# Patient Record
Sex: Female | Born: 1950 | Race: White | Hispanic: Yes | Smoking: Never smoker
Health system: Southern US, Community
[De-identification: ages and names within clinical notes are randomized; demographics above are authoritative.]

## PROBLEM LIST (undated history)

## (undated) DIAGNOSIS — M1990 Unspecified osteoarthritis, unspecified site: Secondary | ICD-10-CM

## (undated) DIAGNOSIS — M545 Low back pain: Secondary | ICD-10-CM

## (undated) DIAGNOSIS — I209 Angina pectoris, unspecified: Secondary | ICD-10-CM

## (undated) DIAGNOSIS — E079 Disorder of thyroid, unspecified: Secondary | ICD-10-CM

## (undated) DIAGNOSIS — Z87898 Personal history of other specified conditions: Secondary | ICD-10-CM

## (undated) DIAGNOSIS — G8929 Other chronic pain: Secondary | ICD-10-CM

## (undated) DIAGNOSIS — E039 Hypothyroidism, unspecified: Secondary | ICD-10-CM

## (undated) DIAGNOSIS — F32A Depression, unspecified: Secondary | ICD-10-CM

## (undated) DIAGNOSIS — G4730 Sleep apnea, unspecified: Secondary | ICD-10-CM

## (undated) DIAGNOSIS — F329 Major depressive disorder, single episode, unspecified: Secondary | ICD-10-CM

## (undated) DIAGNOSIS — I4891 Unspecified atrial fibrillation: Secondary | ICD-10-CM

## (undated) DIAGNOSIS — K219 Gastro-esophageal reflux disease without esophagitis: Secondary | ICD-10-CM

## (undated) DIAGNOSIS — J189 Pneumonia, unspecified organism: Secondary | ICD-10-CM

## (undated) DIAGNOSIS — R0602 Shortness of breath: Secondary | ICD-10-CM

## (undated) DIAGNOSIS — I10 Essential (primary) hypertension: Secondary | ICD-10-CM

## (undated) DIAGNOSIS — T7840XA Allergy, unspecified, initial encounter: Secondary | ICD-10-CM

## (undated) DIAGNOSIS — M353 Polymyalgia rheumatica: Secondary | ICD-10-CM

## (undated) DIAGNOSIS — E785 Hyperlipidemia, unspecified: Secondary | ICD-10-CM

## (undated) HISTORY — DX: Gastro-esophageal reflux disease without esophagitis: K21.9

## (undated) HISTORY — DX: Polymyalgia rheumatica: M35.3

## (undated) HISTORY — DX: Allergy, unspecified, initial encounter: T78.40XA

## (undated) HISTORY — PX: ABDOMINAL HYSTERECTOMY: SHX81

## (undated) HISTORY — DX: Unspecified osteoarthritis, unspecified site: M19.90

## (undated) HISTORY — DX: Personal history of other specified conditions: Z87.898

## (undated) HISTORY — DX: Disorder of thyroid, unspecified: E07.9

## (undated) HISTORY — PX: CHOLECYSTECTOMY: SHX55

## (undated) HISTORY — PX: INCONTINENCE SURGERY: SHX676

## (undated) HISTORY — DX: Hypothyroidism, unspecified: E03.9

## (undated) HISTORY — DX: Hyperlipidemia, unspecified: E78.5

## (undated) HISTORY — PX: FRACTURE SURGERY: SHX138

---
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
## 1998-10-26 ENCOUNTER — Other Ambulatory Visit: Admission: RE | Admit: 1998-10-26 | Discharge: 1998-10-26 | Payer: Self-pay | Admitting: *Deleted

## 1998-12-07 ENCOUNTER — Encounter: Payer: Self-pay | Admitting: Urology

## 1998-12-07 ENCOUNTER — Encounter: Admission: RE | Admit: 1998-12-07 | Discharge: 1998-12-07 | Payer: Self-pay | Admitting: Urology

## 1999-06-06 ENCOUNTER — Encounter: Payer: Self-pay | Admitting: Podiatry

## 1999-06-06 ENCOUNTER — Ambulatory Visit (HOSPITAL_COMMUNITY): Admission: RE | Admit: 1999-06-06 | Discharge: 1999-06-06 | Payer: Self-pay | Admitting: Podiatry

## 2002-01-21 HISTORY — PX: ANKLE FRACTURE SURGERY: SHX122

## 2003-01-22 HISTORY — PX: TOTAL ABDOMINAL HYSTERECTOMY: SHX209

## 2003-03-19 ENCOUNTER — Emergency Department (HOSPITAL_COMMUNITY): Admission: EM | Admit: 2003-03-19 | Discharge: 2003-03-20 | Payer: Self-pay | Admitting: Emergency Medicine

## 2003-10-23 ENCOUNTER — Emergency Department (HOSPITAL_COMMUNITY): Admission: EM | Admit: 2003-10-23 | Discharge: 2003-10-24 | Payer: Self-pay | Admitting: Emergency Medicine

## 2004-03-19 ENCOUNTER — Emergency Department (HOSPITAL_COMMUNITY): Admission: EM | Admit: 2004-03-19 | Discharge: 2004-03-19 | Payer: Self-pay | Admitting: Emergency Medicine

## 2005-03-04 ENCOUNTER — Ambulatory Visit: Payer: Self-pay | Admitting: Internal Medicine

## 2005-03-15 ENCOUNTER — Ambulatory Visit: Payer: Self-pay

## 2005-04-26 ENCOUNTER — Encounter: Admission: RE | Admit: 2005-04-26 | Discharge: 2005-04-26 | Payer: Self-pay | Admitting: Family Medicine

## 2005-04-29 ENCOUNTER — Encounter: Admission: RE | Admit: 2005-04-29 | Discharge: 2005-04-29 | Payer: Self-pay | Admitting: Family Medicine

## 2006-01-21 DIAGNOSIS — G8929 Other chronic pain: Secondary | ICD-10-CM

## 2006-01-21 DIAGNOSIS — M545 Low back pain, unspecified: Secondary | ICD-10-CM

## 2006-01-21 HISTORY — DX: Other chronic pain: G89.29

## 2006-01-21 HISTORY — DX: Low back pain, unspecified: M54.50

## 2006-01-23 ENCOUNTER — Emergency Department (HOSPITAL_COMMUNITY): Admission: EM | Admit: 2006-01-23 | Discharge: 2006-01-23 | Payer: Self-pay | Admitting: Emergency Medicine

## 2006-04-18 ENCOUNTER — Ambulatory Visit: Payer: Self-pay | Admitting: Internal Medicine

## 2006-07-22 ENCOUNTER — Emergency Department (HOSPITAL_COMMUNITY): Admission: EM | Admit: 2006-07-22 | Discharge: 2006-07-23 | Payer: Self-pay | Admitting: Emergency Medicine

## 2008-02-18 ENCOUNTER — Ambulatory Visit: Payer: Self-pay | Admitting: Internal Medicine

## 2008-02-25 ENCOUNTER — Ambulatory Visit: Payer: Self-pay | Admitting: Internal Medicine

## 2008-02-25 LAB — CONVERTED CEMR LAB
Cholesterol: 199 mg/dL (ref 0–200)
HDL: 56.5 mg/dL (ref >39.0)
Total CHOL/HDL Ratio: 3.5

## 2008-03-02 DIAGNOSIS — R079 Chest pain, unspecified: Secondary | ICD-10-CM

## 2008-03-02 DIAGNOSIS — E785 Hyperlipidemia, unspecified: Secondary | ICD-10-CM | POA: Insufficient documentation

## 2008-03-14 ENCOUNTER — Ambulatory Visit: Payer: Self-pay | Admitting: Internal Medicine

## 2008-03-14 ENCOUNTER — Inpatient Hospital Stay (HOSPITAL_COMMUNITY): Admission: EM | Admit: 2008-03-14 | Discharge: 2008-03-16 | Payer: Self-pay | Admitting: Emergency Medicine

## 2008-03-16 HISTORY — PX: CARDIAC CATHETERIZATION: SHX172

## 2008-05-12 ENCOUNTER — Telehealth: Payer: Self-pay | Admitting: Internal Medicine

## 2008-09-05 ENCOUNTER — Other Ambulatory Visit: Admission: RE | Admit: 2008-09-05 | Discharge: 2008-09-05 | Payer: Self-pay | Admitting: Neurosurgery

## 2009-02-23 ENCOUNTER — Encounter: Admission: RE | Admit: 2009-02-23 | Discharge: 2009-02-23 | Payer: Self-pay | Admitting: Family Medicine

## 2009-02-28 ENCOUNTER — Ambulatory Visit: Payer: Self-pay | Admitting: Internal Medicine

## 2009-03-15 ENCOUNTER — Encounter: Payer: Self-pay | Admitting: Internal Medicine

## 2009-04-04 ENCOUNTER — Telehealth: Payer: Self-pay | Admitting: Internal Medicine

## 2009-05-08 ENCOUNTER — Encounter: Admission: RE | Admit: 2009-05-08 | Discharge: 2009-05-08 | Payer: Self-pay | Admitting: Family Medicine

## 2009-05-15 ENCOUNTER — Telehealth: Payer: Self-pay | Admitting: Internal Medicine

## 2009-06-28 ENCOUNTER — Telehealth: Payer: Self-pay | Admitting: Internal Medicine

## 2009-07-08 ENCOUNTER — Emergency Department (HOSPITAL_COMMUNITY): Admission: EM | Admit: 2009-07-08 | Discharge: 2009-07-08 | Payer: Self-pay | Admitting: Family Medicine

## 2009-09-06 ENCOUNTER — Emergency Department (HOSPITAL_COMMUNITY): Admission: EM | Admit: 2009-09-06 | Discharge: 2009-09-06 | Payer: Self-pay | Admitting: Emergency Medicine

## 2009-09-07 ENCOUNTER — Telehealth: Payer: Self-pay | Admitting: Internal Medicine

## 2009-09-13 ENCOUNTER — Telehealth: Payer: Self-pay | Admitting: Internal Medicine

## 2009-09-13 ENCOUNTER — Telehealth (INDEPENDENT_AMBULATORY_CARE_PROVIDER_SITE_OTHER): Payer: Self-pay | Admitting: *Deleted

## 2009-09-15 ENCOUNTER — Telehealth: Payer: Self-pay | Admitting: Internal Medicine

## 2009-09-27 ENCOUNTER — Ambulatory Visit: Payer: Self-pay | Admitting: Internal Medicine

## 2009-09-27 ENCOUNTER — Telehealth (INDEPENDENT_AMBULATORY_CARE_PROVIDER_SITE_OTHER): Payer: Self-pay | Admitting: *Deleted

## 2009-09-27 ENCOUNTER — Encounter: Payer: Self-pay | Admitting: Pulmonary Disease

## 2009-09-27 DIAGNOSIS — E039 Hypothyroidism, unspecified: Secondary | ICD-10-CM

## 2009-09-27 DIAGNOSIS — R05 Cough: Secondary | ICD-10-CM

## 2009-09-28 ENCOUNTER — Telehealth (INDEPENDENT_AMBULATORY_CARE_PROVIDER_SITE_OTHER): Payer: Self-pay | Admitting: *Deleted

## 2009-09-28 ENCOUNTER — Emergency Department (HOSPITAL_COMMUNITY): Admission: EM | Admit: 2009-09-28 | Discharge: 2009-09-28 | Payer: Self-pay | Admitting: Emergency Medicine

## 2009-10-09 ENCOUNTER — Telehealth (INDEPENDENT_AMBULATORY_CARE_PROVIDER_SITE_OTHER): Payer: Self-pay | Admitting: *Deleted

## 2009-10-11 ENCOUNTER — Ambulatory Visit: Payer: Self-pay | Admitting: Internal Medicine

## 2009-10-17 ENCOUNTER — Telehealth (INDEPENDENT_AMBULATORY_CARE_PROVIDER_SITE_OTHER): Payer: Self-pay | Admitting: *Deleted

## 2009-10-18 ENCOUNTER — Ambulatory Visit: Payer: Self-pay | Admitting: Internal Medicine

## 2009-10-18 DIAGNOSIS — R0602 Shortness of breath: Secondary | ICD-10-CM | POA: Insufficient documentation

## 2009-10-27 ENCOUNTER — Telehealth (INDEPENDENT_AMBULATORY_CARE_PROVIDER_SITE_OTHER): Payer: Self-pay | Admitting: *Deleted

## 2009-11-01 ENCOUNTER — Ambulatory Visit: Payer: Self-pay | Admitting: Internal Medicine

## 2010-02-11 ENCOUNTER — Encounter: Payer: Self-pay | Admitting: Family Medicine

## 2010-02-20 NOTE — Progress Notes (Signed)
Summary: cough, SOB  Phone Note Call from Patient Call back at Home Phone [phone]   Caller: Patient Call For: Wert Summary of Call: difficulty breathing, tightness in chest, dry cough, increased SOB with activity x30month, worse over the past couple of days - denies f/c/s.  patient states she would like something to help with the pain associated with the cough.  states the dexilant given at 9.21.11 ov has not helped.  ***NOTE: patient states that she called earlier at 3pm - there is no documentation of this.  [employer] [address].  allergies: 1)  ! Codeine, 2)  ! Sulfa, 3)  ! * Tramadol, 4)  ! * Pepcid, 5)  ! * Pantoprazole and "anything" assicociated with opiates".    MW is out of the office, will send to doc of the day. Initial call taken by: Boone Master CNA/MA,  October 17, 2009 4:35 PM  Follow-up for Phone Call        per SN, recommend Delsym OTC 2 tsp every 12 horus as needed for cough and needs ov.  called and spoke with pt.  pt aware of SN's recs.  Pt scheduled to see MR tomorrow for a sick work in at 11am.  Arman Filter LPN  October 17, 2009 5:39 PM

## 2010-02-20 NOTE — Progress Notes (Signed)
Summary: Pt request call  Phone Note Call from Patient Call back at [phone] or [phone]   Caller: Patient Reason for Call: Talk to Nurse Initial call taken by: Judie Grieve,  May 15, 2009 4:37 PM  Follow-up for Phone Call        PT calling nack request call Judie Grieve  May 16, 2009 10:04 AM  Additional Follow-up for Phone Call Additional follow up Details #1::        Spoke with mother, she requests screening EKG's on children annually.  Recall changed to next year for children. Gypsy Balsam RN BSN  May 17, 2009 4:56 PM

## 2010-02-20 NOTE — Progress Notes (Signed)
Summary: calling to get referral to Pul office  Phone Note Call from Patient Call back at Home Phone [phone]   Caller: Patient Summary of Call: PT would like a referral to out Pul office Initial call taken by: Judie Grieve,  September 15, 2009 12:52 PM  Follow-up for Phone Call        I spoke with the pt and she would like a referral to a pulmonary doctor.  The pt said she went to the ER on 09/06/09 for CP and was told her symptoms were costochondriitis.  She did touch base with our office and Dr Graciela Husbands recommended she f/u with PCP.  The pt called her PCP this morning and they could not see her because her physician was not in the office.  The pt called [place] because she would like to see a pulmonary doctor today because of pain when see breaths.  I made her aware that a pulmonary MD would not treat these symptoms and she needs to see the PCP.  The pt said she has 3 pills of Ibuprofen left and refuses to get this refilled or take this anymore.  I advised the pt that she could use warm and cold compresses on her chest as needed and I recommended that she continue Ibuprofen.  I told her she could go to an Urgent Care this weekend if she could not obtain care at PCP office.  I explained to the pt what costochondritis is and that NSAIDs are the main treatment for her symptoms. The pt agreed to f/u with PCP.    Follow-up by: Julieta Gutting, RN, BSN,  September 15, 2009 1:16 PM

## 2010-02-20 NOTE — Progress Notes (Signed)
Summary: chest pain  Phone Note Outgoing Call   Call placed by: Katina Dung, RN, BSN,  September 13, 2009 4:46 PM Call placed to: Patient  Follow-up for Phone Call        I called pt to let her know that Dr Graciela Husbands did not feel she need the [facility] scheduled for tomorrow--per Dr Graciela Husbands pt  had a cath 02/2008 that was normal and Dr Graciela Husbands did not feel she needed any futher cardiac workup for her chest pain--pt continues to have chest discomfort and soreness that goes from her ribcage to her back-it is unchanged with movement -she states it has been constant the last couple of days-she also states she is unable to take a deep breath because of the discomfort --she also  has some coughing associated with trying to take a deep breath--she was given Ibuprofen 800mg  to take as needed that does give her some relief from the chest discomfort but she does not want to take it regularly--pt asking for further recommendations from Dr Harriette Ohara wants to know the cause of her chest discomfort-I will forward this to Dr Graciela Husbands for review and followup with pt     Appended Document: chest pain PT AWARE TO F/U WITH PMD PER DR KLEIN./CY

## 2010-02-20 NOTE — Progress Notes (Signed)
Summary: needs myoview per Dr Graciela Husbands  Phone Note Call from Patient Call back at work# [phone] home# [phone]   Caller: Patient Reason for Call: Talk to Nurse, Talk to Doctor Summary of Call: pt was in ER last night with chest pain and was told [name] was contacted and pt is to be worked in asap she will be at work until 5pm  Initial call taken by: Omer Jack,  September 07, 2009 4:27 PM  Follow-up for Phone Call        spoke with Dr Graciela Husbands about recent ED visit for pt and need to follow up.  Dr Graciela Husbands gave verbal orders for a exercise myoview.  Pt will be called for an appoinment.  order placed - attempted to call to discuss testing but was unable to reach her.  Follow-up by: Charolotte Capuchin, RN,  September 07, 2009 5:23 PM  Additional Follow-up for Phone Call Additional follow up Details #1::        UNABLE TO LEAVE MESSAGE AT [facility] PHONE NUMBER Scherrie Bateman, LPN  September 08, 2009 11:41 AM  pt calling back-correct # [phone] pls call Glynda Jaeger  September 08, 2009 12:56 PM     Additional Follow-up for Phone Call Additional follow up Details #2::    PT AWARE OF TX PLAN EXERCISE MYOVIEW SCHEDULED.FOR 09/14/09 AT 7:15. Follow-up by: Scherrie Bateman, LPN,  September 08, 2009 4:16 PM  Additional Follow-up for Phone Call Additional follow up Details #3:: Details for Additional Follow-up Action Taken: PT CALLED ONCE AGAIN CONT TO C/O CHEST PAIN UNABLE TO TAKE DEEP BREATH IBUPROFEN  TAKEN AROUND 1:30  INSTRUCTED MAY TRY USING HEAT OR ICE PACK TO SEE IF GETS SOME RELIEF THAT ROUTE VERBALIZED UNDERSTANDING. Additional Follow-up by: Nathen May, MD, [facility],  September 11, 2009 9:37 AM

## 2010-02-20 NOTE — Progress Notes (Signed)
Summary: Nuc Pre-Procedure  Phone Note Outgoing Call Call back at [facility] Phone [phone]   Call placed by: Antionette Char RN,  September 13, 2009 1:52 PM Call placed to: Patient Reason for Call: Confirm/change Appt Summary of Call: Reviewed information on Myoview Information Sheet (see scanned document for further details).  Spoke with patient.     Nuclear Med Background Indications for Stress Test: Evaluation for Ischemia   History: Myocardial Perfusion Study  History Comments: MPS Nml  Symptoms: Chest Pain  Symptoms Comments: Atypical CP   Nuclear Pre-Procedure Cardiac Risk Factors: Family History - CAD, Lipids Height (in): 61

## 2010-02-20 NOTE — Assessment & Plan Note (Signed)
Summary: Pulmonary/ ext f/u ov:  rec repeat max gerd rx then echo/cta   Copy to:  Dr. Wende Bushy Primary Wilbur [place]/Referring Ilse Billman:  Dr. Brandt Loosen  CC:  Cough and dyspnea- cough is gone. Still c/o DOE.[name]  History of Present Illness: 60 yo hispanic female never smoked no prev resp problems until summer of 2011  September 27, 2009  1st pulmonary office eval abrupt onset cough lying down around first of July also present during the day also worse talking,  asthmanex may have breathing but not cough, dry > wet, night > day,  assoc with mild sore throat, dysphagia, nonspecific cp and variable doe.Nexium 40 mg Take  one 30-60 min before first meal of the day and Pepcid 20 mg one at bedtime Stop inhaler  Prednisone 4 each am x 2days, 2x2days, 1x2days and stop  Take delsym two tsp every 12 hours and add tramadol 50   Took none of the medicatons >  non-specific reactions, did stop inhaler  October 11, 2009 Cough follow-up...the patient says her cough has improved but she is still SOB with exertion and when talking.  cough is dry.   October 18, 2009 ACUTE VISIT: 60 year old female. Non smoker. STarted seeing Dr. Sherene Sires  on 09/27/2009 for chronic cough since July. A trial of prednisone burst, GERD RX with pantaprozaole and tramadol was advised. However, she never took the prednisone, the tramadol resulted in ER visit due to dyspnea (now considered allergic to tramadol) and pantaproazole resulted in diarhea by 10/09/2009. She followed with Dr. Sherene Sires on 10/11/2009 and was switched to dexilant. THere was some report of improved cough. However, she called back on 10/17/2009 reporting dexilant was not helping rec  no change rx  November 01, 2009 ov cc:  Cough and dyspnea- cough is gone. Still c/o DOE x > slow adls with chest tight esp if talks alot.  Pt denies any significant sore throat, dysphagia, itching, sneezing,  nasal congestion or excess secretions,  fever, chills, sweats, unintended wt loss, pleuritic or  exertional cp, hempoptysis, change in activity tolerance  orthopnea pnd or leg swelling    Current Medications (verified): 1)  Aspirin 81 Mg  Tabs (Aspirin) .[name].. 1 By Mouth Daily- Stopped Taking 2)  Synthroid 125 Mcg Tabs (Levothyroxine Sodium) .[name].. 1 By Mouth Daily 3)  Estrace 1 Mg Tabs (Estradiol) .[name].. 1 By Mouth Daily 4)  Oxybutynin Chloride 5 Mg Tabs (Oxybutynin Chloride) .[name].. 1 By Mouth Three Times A Day 5)  Simvastatin 20 Mg Tabs (Simvastatin) .[name].. 1 By Mouth Daily 6)  Epipen 0.3 Mg/0.24ml Devi (Epinephrine) .... As Directed 7)  Prenatal Plus 27-1 Mg Tabs (Prenatal Vit-Fe Fumarate-Fa) .[name].. 1 By Mouth Daily 8)  Prolia 60 Mg/ml Soln (Denosumab) .[name].. 1 Injection Every 6 Months 9)  Prilosec Otc 20 Mg Tbec (Omeprazole Magnesium) .... Take 1 Tablet By Mouth Two Times A Day 30 To 60 Mins Before First and Last Meal of The Day-Never Started Taking This! 10)  Calcium-Vitamin D 600-200 Mg-Unit Tabs (Calcium-Vitamin D) .... Take 1 Tablet By Mouth Three Times A Day- Stopped Taking 11)  Advil 200 Mg Tabs (Ibuprofen) .... As Needed 12)  Tylenol 325 Mg Tabs (Acetaminophen) .... Per Bottle Directions As Needed  Allergies (verified): 1)  ! Codeine 2)  ! Sulfa 3)  ! * Tramadol 4)  ! * Pepcid 5)  ! * Pantoprazole  Past History:  Past Medical History: atypical chest pain  normal cath feb 2010 Obese hypothyroidism hyperlipidemia Recurrent Pneumonia  Chronic cough...............................................Gwendolyn KitchenSherene Sires     - onset July 2011 DOE ? etiology      -PFT's wnl  11/01/09  Vital Signs:  Patient profile:   60 year old female Weight:      161 pounds O2 Sat:      98 % on Room air Temp:     98.3 degrees F oral Pulse rate:   91 / minute BP sitting:   130 / 74  (left arm)  Vitals Entered By: Vernie Murders (November 01, 2009 12:08 PM)  O2 Flow:  Room air  Physical Exam  Additional Exam:  wt 168 September 27, 2009> 167 October 11, 2009  > 161  amb depressed hispanic female with  classic voice fatigue and now less  striking  pseudowheeze resolves with purse lip maneuver  HEENT: nl dentition, turbinates, and orophanx. Nl external ear canals without cough reflex NECK :  without JVD/Nodes/TM/ nl carotid upstrokes bilaterally LUNGS: no acc muscle use, clear to A and P bilaterally without cough on insp or exp maneuvers CV:  RRR  no s3 or murmur or increase in P2, no edema   ABD:  soft and nontender with nl excursion in the supine position. No bruits or organomegaly, bowel sounds nl MS:  warm without deformities, calf tenderness, cyanosis or clubbing      Impression & Recommendations:  Problem # 1:  SHORTNESS OF BREATH (SOB) (ICD-786.05)  Unexplained by cardiac/ pulmonary eval to date.  Have not excluded PAH but this is unlikely.  Has not been consistent enough with GERD rx to exclude this very likely suspect.  I had an extended discussion with the patient today lasting 15 to 20 minutes of a 25 minute visit on the following issues:  NB the  ramp to expected improvement (and for that matter, worsening, if a chronic effective medication is stopped)  can be measured in weeks, not days, a common misconception because this is not Heartburn with no immediate cause and effect relationship so that response to therapy or lack thereof can be very difficult to assess.   See instructions for specific recommendations   Orders: Est. Patient Level IV (16109)  Medications Added to Medication List This Visit: 1)  Aspirin 81 Mg Tabs (Aspirin) .[name].. 1 by mouth daily- stopped taking 2)  Prilosec Otc 20 Mg Tbec (Omeprazole magnesium) .... Take 1 tablet by mouth two times a day 30 to 60 mins before first and last meal of the day-never started taking this! 3)  Prilosec Otc 20 Mg Tbec (Omeprazole magnesium) .... Take one 30-60 min before first and last meals of the day 4)  Calcium-vitamin D 600-200 Mg-unit Tabs (Calcium-vitamin d) .... Take 1 tablet by mouth three times a day- stopped  taking 5)  Tylenol 325 Mg Tabs (Acetaminophen) .... Per bottle directions as needed  Patient Instructions: 1)  Prilosec 20mg  Take  one 30-60 min before first meal of the day and pepcid 20mg  one at bedtime for a full month  2)  GERD (REFLUX)  is a common cause of respiratory symptoms. It commonly presents without heartburn and can be treated with medication, but also with lifestyle changes including avoidance of late meals, excessive alcohol, smoking cessation, and avoid fatty foods, chocolate, peppermint, colas, red wine, and acidic juices such as orange juice. NO MINT OR MENTHOL PRODUCTS SO NO COUGH DROPS  3)  USE SUGARLESS CANDY INSTEAD (jolley ranchers)  4)  NO OIL BASED VITAMINS  5)  If  shortness of breath not  improving in 2 weeks call [facility] Free at [phone] for CT chest angiogram and echocardiogram. 6)   If improving stay on this regimen and then return here at end of 4 weeeks 7)  Late add:  there is an error in the instructions.  prilosec should be Take one 30-60 min before first and last meals of the day   Appended Document: Pulmonary/ ext f/u ov:  rec repeat max gerd rx then echo/cta see late add to instructions  Appended Document: Pulmonary/ ext f/u ov:  rec repeat max gerd rx then echo/cta LMOMTCB  Appended Document: Pulmonary/ ext f/u ov:  rec repeat max gerd rx then echo/cta Spoke with pt and notified of late add instructions.

## 2010-02-20 NOTE — Assessment & Plan Note (Signed)
Summary: cough/sob/wert pt/mg   Visit Type:  Follow-up Copy to:  Dr. Wende [name] Primary [name]/Referring [name]:  Dr. Brandt [name]  CC:  Pt c/o increased SOB, chest soreness, and dry cough x 3 days. Marland Kitchen  History of Present Illness: October 18, 2009 ACUTE VISIT: 60 year old female. Non smoker. STarted seeing Dr. [name]  on 09/27/2009 for chronic cough since July. A trial of prednisone burst, GERD RX with pantaprozaole and tramadol was advised. However, she never took the prednisone, the tramadol resulted in ER visit due to dyspnea (now considered allergic to tramadol) and pantaproazole resulted in diarhea by 10/09/2009. She followed with Dr. [name] on 10/11/2009 and was switched to dexilant. THere was some report of improved cough. However, she called back on 10/17/2009 reporting dexilant was not helping. Therefore, acute visit today.   Today, she is reporting persistent cough since July. It is dry. 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 No clear cut relief. Associated with chest pain that is clearly cough related. Taking advil for it. Denis obvious gerd symptoms like heart burn. In addition, complains of class 2-3 dyspnea on exertion relieved by rest. On exertion today 185 feet x 3 laps in office she did notdesaturate but became tachycardic to 110/min  No syncope, hemoptysis, sputum, fever, weight loss.    Preventive Screening-Counseling & Management  Alcohol-Tobacco     Smoking Status: never  Current Medications (verified): 1)  Aspirin 81 Mg  Tabs (Aspirin) .Marland Kitchen.. 1 By Mouth Daily 2)  Synthroid 125 Mcg Tabs (Levothyroxine Sodium) .Marland Kitchen.. 1 By Mouth Daily 3)  Estrace 1 Mg Tabs (Estradiol) .Marland Kitchen.. 1 By Mouth Daily 4)  Oxybutynin Chloride 5 Mg Tabs (Oxybutynin Chloride) .Marland Kitchen.. 1 By Mouth Three Times A Day 5)  Simvastatin 20 Mg Tabs (Simvastatin) .Marland Kitchen.. 1 By Mouth Daily 6)  Epipen 0.3 Mg/0.63ml Devi (Epinephrine) .... As Directed 7)  Prenatal Plus 27-1 Mg Tabs (Prenatal Vit-Fe  Fumarate-Fa) .Marland Kitchen.. 1 By Mouth Daily 8)  Prolia 60 Mg/ml Soln (Denosumab) .Marland Kitchen.. 1 Injection Every 6 Months 9)  Dexilant 60 Mg Cpdr (Dexlansoprazole) .... Take One By Mouth Daily 10)  Calcium-Vitamin D 600-200 Mg-Unit Tabs (Calcium-Vitamin D) .... Take 1 Tablet By Mouth Three Times A Day 11)  Advil 200 Mg Tabs (Ibuprofen) .... As Needed  Allergies (verified): 1)  ! Codeine 2)  ! Sulfa 3)  ! * Tramadol 4)  ! * Pepcid 5)  ! * Pantoprazole  Past History:  Past medical, surgical, family and social histories (including risk factors) reviewed, and no changes noted (except as noted below).  Past Medical History: Reviewed history from 09/27/2009 and no changes required. atypical chest pain  normal cath feb 2010 Obese hypothyroidism hyperlipidemia Recurrent Pneumonia  Chronic cough...............................................Marland KitchenSherene [name]     - onset July 2011  Past Surgical History: Reviewed history from 09/27/2009 and no changes required. partial thyroidectomy complete hysterectomy bilateral ankle surgery  Family History: Reviewed history from 09/27/2009 and no changes required. Son deceased from WPW Mother deceased from liver ca sister 1- breast ca sister 2-kidney ca  Social History: Reviewed history from 09/27/2009 and no changes required. son died of ventricular fibrillation with WPW widowed 2 living children Works with [employer] Patient never smoked.  Smoking Status:  never  Review of Systems       The patient complains of chest pain and prolonged cough.  The patient denies anorexia, fever, weight loss, weight gain, vision loss, decreased hearing, hoarseness, syncope, dyspnea on exertion,  peripheral edema, headaches, hemoptysis, abdominal pain, melena, hematochezia, severe indigestion/heartburn, hematuria, incontinence, genital sores, muscle weakness, suspicious skin lesions, transient blindness, difficulty walking, depression, unusual weight change, abnormal  bleeding, enlarged lymph nodes, angioedema, breast masses, and testicular masses.    Vital Signs:  Patient profile:   60 year old female Height:      61 inches Weight:      163.50 pounds BMI:     31.00 O2 Sat:      95 % on Room air Temp:     98.2 degrees F oral Pulse rate:   75 / minute BP sitting:   126 / 80  (right arm) Cuff size:   regular  Vitals Entered By: Carron Curie CMA (October 18, 2009 11:09 AM)  O2 Flow:  Room air CC: Pt c/o increased SOB, chest soreness, dry cough x 3 days.  Comments Medications reviewed with patient Carron Curie CMA  October 18, 2009 11:11 AM Daytime phone number verified with patient.    Physical Exam  General:  well developed, well nourished, in no acute distress Head:  normocephalic and atraumatic Eyes:  PERRLA/EOM intact; conjunctiva and sclera clear Ears:  TMs intact and clear with normal canals Nose:  no deformity, discharge, inflammation, or lesions Mouth:  no deformity or lesions Neck:  no masses, thyromegaly, or abnormal cervical nodes Chest Wall:  no deformities noted Lungs:  clear bilaterally to auscultation and percussion Heart:  regular rate and rhythm, S1, S2 without murmurs, rubs, gallops, or clicks Abdomen:  bowel sounds positive; abdomen soft and non-tender without masses, or organomegaly Msk:  no deformity or scoliosis noted with normal posture Pulses:  pulses normal Extremities:  no clubbing, cyanosis, edema, or deformity noted Neurologic:  CN II-XII grossly intact with normal reflexes, coordination, muscle strength and tone Skin:  intact without lesions or rashes Cervical Nodes:  no significant adenopathy Axillary Nodes:  no significant adenopathy Psych:  depressed affect and anxious.     Impression & Recommendations:  Problem # 1:  COUGH (ICD-786.2) Assessment Unchanged  unable to comply with recommendations of Dr. [name] due to some side effect or other. Currently on dexilant for 1 week but also  taking advil for chest pain. She is yet to see relief with cough in 1 week of dexilant. Advised to come off advil and educated that empiric trial would need longer time to see improvement (just as Dr. [name] has already explained to her). Will defer further fu to Dr. [name]  Orders: Est. Patient Level III (32440)  Problem # 2:  SHORTNESS OF BREATH (SOB) (ICD-786.05) Assessment: New  get PFTs (walking desat test in office is normal) fu Dr. [name]  Orders: Est. Patient Level III (10272)  Problem # 3:  CHEST PAIN UNSPECIFIED (ICD-786.50) Assessment: Unchanged  this is non cardiac and is related to cough  plan advised to come off advil take tylenol as needed fu Dr [name]  Orders: Est. Patient Level III (53664)  Medications Added to Medication List This Visit: 1)  Calcium-vitamin D 600-200 Mg-unit Tabs (Calcium-vitamin d) .... Take 1 tablet by mouth three times a day 2)  Advil 200 Mg Tabs (Ibuprofen) .... As needed  Patient Instructions: 1)  unclear why you are having the symtpoms 2)  please have full breathing test called PFT 3)  please followup with DR. [name] 4)  For chest pains take tylenol as needed but never exceed 8 tables in  24h 5)  have flu shot today   Immunization History:  Influenza  Immunization History:    Influenza:  fluvax 3+ (10/25/2008)   Appended Document: cough/sob/wert pt/mg Ambulatory Pulse Oximetry  Resting; HR_76____    02 Sat__95___  Lap1 (185 feet)   HR_96____   02 Sat_90____ Lap2 (185 feet)   HR__110__   02 Sat___91__    Lap3 (185 feet)   HR___110__   02 Sat___95__  __x_Test Completed without Difficulty ___Test Stopped due to:

## 2010-02-20 NOTE — Progress Notes (Signed)
Summary: Apparent allergy to tramadol  Phone Note From Pharmacy   Caller: [facility] [address] Call For: wert  Summary of Call: pharmacist named prince requests to speak to dr wert asap re: allergic reactions pt having re: tramadol and codeine. [phone] Initial call taken by: Tivis Ringer, CNA,  September 28, 2009 9:57 AM  Follow-up for Phone Call        called spoke with prince, informed him that our office is aware of that cross-reaction between tramadol and codeine; that leslie spoke with patient this morning who stated she had gone to the ED last night and has subsequently dc'd the tramadol.  we went through the time-line of events for his documentation (the patient had called last night approx 1 hour before speaking with [facility]).  prince verbalized his understanding that MW is aware of the cross-reaction of tramadol and codeine and that the tramadol has been dc'd.  will add tramadol to pt's allergy list.  will forward to MW to make him aware of this conversation. Follow-up by: Boone Master CNA/MA,  September 28, 2009 10:16 AM  Additional Follow-up for Phone Call Additional follow up Details #1::        noted Additional Follow-up by: Nyoka Cowden MD,  September 28, 2009 12:12 PM   New Allergies: ! * TRAMADOL New Allergies: ! * TRAMADOL

## 2010-02-20 NOTE — Assessment & Plan Note (Signed)
Summary: Pulmonary ext ov, nonadherent, rechallenge cough with ppi   Visit Type:  Follow-up Copy to:  Dr. Wende Bushy Primary Provider/Referring Provider:  Dr. Brandt Loosen  CC:  Cough follow-up...the patient says her cough has improved but she is still SOB with exertion and when talking.  History of Present Illness: 60 yo hispanic female never smoked no prev resp problems until summer of 2011  September 27, 2009  1st pulmonary office eval abrupt onset cough lying down around first of July also present during the day also worse talking,  asthmanex may have breathing but not cough, dry > wet, night > day,  assoc with mild sore throat, dysphagia, nonspecific cp and variable doe.Nexium 40 mg Take  one 30-60 min before first meal of the day and Pepcid 20 mg one at bedtime Stop inhaler  Prednisone 4 each am x 2days, 2x2days, 1x2days and stop  Take delsym two tsp every 12 hours and add tramadol 50   Took none of the medicatons >  non-specific reactions, did stop inhaler  October 11, 2009 Cough follow-up...the patient says her cough has improved but she is still SOB with exertion and when talking.  cough is dry.  Pt denies any significant sore throat, dysphagia, itching, sneezing,  nasal congestion or excess secretions,  fever, chills, sweats, unintended wt loss, pleuritic or exertional cp, hempoptysis,  orthopnea pnd or leg swelling          Current Medications (verified): 1)  Aspirin 81 Mg  Tabs (Aspirin) .Marland Kitchen.. 1 By Mouth Daily 2)  Synthroid 125 Mcg Tabs (Levothyroxine Sodium) .Marland Kitchen.. 1 By Mouth Daily 3)  Estrace 1 Mg Tabs (Estradiol) .Marland Kitchen.. 1 By Mouth Daily 4)  Oxybutynin Chloride 5 Mg Tabs (Oxybutynin Chloride) .Marland Kitchen.. 1 By Mouth Three Times A Day 5)  Simvastatin 20 Mg Tabs (Simvastatin) .Marland Kitchen.. 1 By Mouth Daily 6)  Epipen 0.3 Mg/0.34ml Devi (Epinephrine) .... As Directed 7)  Caltrate 600+d 600-400 Mg-Unit Tabs (Calcium Carbonate-Vitamin D) .Marland Kitchen.. 1 By Mouth Three Times A Day 8)  Prenatal Plus 27-1 Mg  Tabs (Prenatal Vit-Fe Fumarate-Fa) .Marland Kitchen.. 1 By Mouth Daily 9)  Prolia 60 Mg/ml Soln (Denosumab) .Marland Kitchen.. 1 Injection Every 6 Months  Allergies (verified): 1)  ! Codeine 2)  ! Sulfa 3)  ! * Tramadol 4)  ! * Pepcid 5)  ! * Pantoprazole  Vital Signs:  Patient profile:   60 year old female Height:      61 inches (154.94 cm) Weight:      167 pounds (75.91 kg) BMI:     31.67 O2 Sat:      97 % on Room air Temp:     98.2 degrees F (36.78 degrees C) oral Pulse rate:   89 / minute BP sitting:   112 / 78  (right arm) Cuff size:   regular  Vitals Entered By: Michel Bickers CMA (October 11, 2009 3:55 PM)  O2 Sat at Rest %:  97 O2 Flow:  Room air  Clinical Reports Reviewed:  CXR:  09/06/2009: CXR Results:  wnl  CC: Cough follow-up...the patient says her cough has improved but she is still SOB with exertion and when talking Comments Medications reviewed with patient Daytime phone verified. Michel Bickers [facility]  October 11, 2009 3:55 PM   Physical Exam  Additional Exam:  wt 168 September 27, 2009> 167 October 11, 2009  amb hispanic female with classic voice fatigue and striking  pseudowheeze resolves with purse lip maneuver  HEENT: nl dentition, turbinates, and  orophanx. Nl external ear canals without cough reflex NECK :  without JVD/Nodes/TM/ nl carotid upstrokes bilaterally LUNGS: no acc muscle use, clear to A and P bilaterally without cough on insp or exp maneuvers CV:  RRR  no s3 or murmur or increase in P2, no edema   ABD:  soft and nontender with nl excursion in the supine position. No bruits or organomegaly, bowel sounds nl MS:  warm without deformities, calf tenderness, cyanosis or clubbing      Impression & Recommendations:  Problem # 1:  COUGH (ICD-786.2) Classic Upper airway cough syndrome,  better off inhlaers, is so named because it's frequently impossible to sort out how much is  CR/sinusitis with freq throat clearing (which can be related to primary GERD)   vs  causing   secondary extra esophageal GERD from wide swings in gastric pressure that occur with throat clearing, promoting self use of mint and menthol lozenges that reduce the lower esophageal sphincter tone and exacerbate the problem further.  These are the same pts who not infrequently have failed to tolerate ace inhibitors,  dry powder inhalers or biphosphonates or report having reflux symptoms that don't respond to standard doses of PPI  I had an extended discussion with the patient today lasting 15 to 20 minutes of a 25 minute visit on the following issues: : The standardized cough guidelines recently published in Chest are a 14 step process, not a single office visit,  and are intended  to address this problem logically,  with an alogrithm dependent on response to each progressive step  to determine a specific diagnosis with  minimal addtional testing needed. Therefore if compliance is an issue this empiric standardized approach simply won't work.  Rechallenge with dexilant/diet to exclude the "leading suspect" before proceeding to addtional w/u  NB the  ramp to expected improvement (and for that matter, worsening, if a chronic effective medication is stopped)  can be measured in weeks, not days, a common misconception because this is not Heartburn with no immediate cause and effect relationship so that response to therapy or lack thereof can be very difficult to assess.   Medications Added to Medication List This Visit: 1)  Estrace 1 Mg Tabs (Estradiol) .Marland Kitchen.. 1 by mouth daily 2)  Oxybutynin Chloride 5 Mg Tabs (Oxybutynin chloride) .Marland Kitchen.. 1 by mouth three times a day 3)  Simvastatin 20 Mg Tabs (Simvastatin) .Marland Kitchen.. 1 by mouth daily 4)  Caltrate 600+d 600-400 Mg-unit Tabs (Calcium carbonate-vitamin d) .Marland Kitchen.. 1 by mouth three times a day 5)  Prenatal Plus 27-1 Mg Tabs (Prenatal vit-fe fumarate-fa) .Marland Kitchen.. 1 by mouth daily 6)  Prolia 60 Mg/ml Soln (Denosumab) .Marland Kitchen.. 1 injection every 6 months 7)  Dexilant 60 Mg Cpdr  (Dexlansoprazole) .... Take one by mouth daily  Other Orders: Est. Patient Level IV (19147)  Patient Instructions: 1)  Dexilant 60 mg Take  one 30-60 min before first meal of the day  2)  Suppress the urge to cough with delsym or sugarless candy  3)  GERD (REFLUX)  is a common cause of respiratory symptoms. It commonly presents without heartburn and can be treated with medication, but also with lifestyle changes including avoidance of late meals, excessive alcohol, smoking cessation, and avoid fatty foods, chocolate, peppermint, colas, red wine, and acidic juices such as orange juice. NO MINT OR MENTHOL PRODUCTS SO NO COUGH DROPS  4)  USE SUGARLESS CANDY INSTEAD (jolley ranchers)  5)  NO OIL BASED VITAMINS  6)  Hold the  calcium and vitamins also for now 7)  Please schedule a follow-up appointment in 2  weeks, sooner if needed

## 2010-02-20 NOTE — Assessment & Plan Note (Signed)
Summary: f1y per pt call/lg   History of Present Illness:    [name] is seen in followup for atypical chest pain. She is a mother of a son who died with Wolff-Parkinson-White syndrome relate ventricular fibrillation.  She has had no problems with chest pain   Current Medications (verified): 1)  Aspirin 81 Mg  Tabs (Aspirin) .Marland Kitchen.. 1 By Mouth Daily 2)  Synthroid 125 Mcg Tabs (Levothyroxine Sodium) .Marland Kitchen.. 1 By Mouth Daily  Allergies (verified): No Known Drug Allergies  Past History:  Past Medical History: Last updated: 03/02/2008 atypical chest pain Obese hypothyroidism hyperlipidemia  Vital Signs:  Patient profile:   60 year old female Height:      61 inches Weight:      168 pounds BMI:     31.86 Pulse rate:   61 / minute Resp:     16 per minute BP sitting:   104 / 76  (right arm)  Vitals Entered By: Marrion Coy, CNA (February 28, 2009 4:04 PM)  Physical Exam  General:  The patient was alert and oriented in no acute distress. HEENT Normal.  Neck veins were flat, carotids were brisk.  Lungs were clear.  Heart sounds were regular without murmurs or gallops.  Abdomen was soft with active bowel sounds. There is no clubbing cyanosis or edema. Skin Warm and dry    Impression & Recommendations:  Problem # 1:  CHEST PAIN UNSPECIFIED (ICD-786.50) stabel Her updated medication list for this problem includes:    Aspirin 81 Mg Tabs (Aspirin) .Marland Kitchen... 1 by mouth daily  Problem # 2:  SUDDEN DEATH, FAMILY HX (ICD-V19.8) we spent approximately 40 minutes discussing family issues related to the sudden death of her eldest son and the consequences on the patient and the two surviving children.  Patient Instructions: 1)  Your physician recommends that you schedule a follow-up appointment in: 24 months  Appended Document:  Cardiology     Allergies: No Known Drug Allergies  Past History:  Past Medical History: atypical chest pain  normal cath feb  2010 Obese hypothyroidism hyperlipidemia

## 2010-02-20 NOTE — Progress Notes (Signed)
Summary: discuss new med and side effects  Phone Note Call from Patient Call back at [phone]   Caller: Patient Reason for Call: Talk to Nurse, Talk to Doctor Summary of Call: pt was given an injection of prolia and one of the side effects is to cause indocarditis and she wants to let someone know Initial call taken by: Omer Jack,  April 04, 2009 10:47 AM  Follow-up for Phone Call        discussed with patient--pt started Prolia injection yeaterday for osteoporosis-she is to take it every 6 months (next one due in September)--she read the medication information given to her by the pharmacy and one of the side effects was endocarditis--she wanted Dr Graciela Husbands  to be aware of this--she was also going to notify Dr Brandt Loosen since she prescribed the Prolia  I will forward to Dr Graciela Husbands for review

## 2010-02-20 NOTE — Progress Notes (Signed)
Summary: rx request/ subs > needs ov-  Phone Note Call from Patient   Caller: Patient Call For: WERT Summary of Call: pt states that she spoke to a nurse previously re: sub for tramadol since this made her heart rate slow don (pt had to go to ed because of this). she says she has been waiting for a sub to be called in to pharm. also states that she took pantoprazole this past sat for the first and "only time". she will not take this again. it made her "very drowsy" also had upset stomach and dizziness. didn't understand why dr would prescribe something that would "knock her out". needs a sub for this as well. [facility] on [address]. pt # [id number] Initial call taken by: Tivis Ringer, CNA,  October 09, 2009 10:01 AM  Follow-up for Phone Call        [facility], pt states that after she took pantoprazel she immediately had diarrhea, nausea and a headache, so she has not taken it and is requesting a substitute. Pt also has stopped taking prednisone because she staes it caused her to be very dizzy and when she laid down she was "knocked out." Pt is requesting something different. Also pt is requesting a sub for tramadol because this was d/c due to reaction with codeine. Please advise on sub for pantoprazole, prednisone, and tramadol. thanks. Carron Curie CMA  October 09, 2009 10:43 AM no can do over the phone, needs ov with me or Tammy with all acitive meds in hand to regroup re complex rx  Follow-up by: Nyoka Cowden MD,  October 09, 2009 12:28 PM  Additional Follow-up for Phone Call Additional follow up Details #1::        [id number] to schedule appt to discuss meds.Carron Curie CMA  October 09, 2009 2:38 PM     Additional Follow-up for Phone Call Additional follow up Details #2::    pt is already scheduled to be seen tomorrow with MW at 3:50pm.  [facility]  informing pt to keep this appt with MW to discuss meds and concerns.  Also instructed pt to bring all active meds she is taking with her  to this appt.  Aundra Millet Reynolds LPN  October 10, 2009 10:03 AM

## 2010-02-20 NOTE — Letter (Signed)
Summary: [facility] - Unilateral Mammogram of the Left Breast  [facility] - Unilateral Mammogram of the Left Breast   Imported By: Marylou Mccoy 07/27/2009 11:12:09  _____________________________________________________________________  External Attachment:    Type:   Image     Comment:   External Document

## 2010-02-20 NOTE — Miscellaneous (Signed)
Summary: phone call regarding tramadol and codeine allergy  Clinical Lists Changes   pt phoned regarding tramadol script.  read on educational material that tramadol cross reacts with codeine.  states that she is highly allergic to codeine.  she had already taken a tramadol before noticing.  she is having no issues currently.  I asked her not take any more, and to monitor how she does tonight.  to er if having difficulty. Asked her to call office in am to get message to Dr. Sherene Sires.  Appended Document: phone call regarding tramadol and codeine allergy let her know we reviewed this issue in the office and it's not likely to bother her - if she has itching or swelling she should stop it and take benadryl but she should go ahead and take it  Appended Document: phone call regarding tramadol and codeine allergy [id number]  Appended Document: phone call regarding tramadol and codeine allergy Called and spoke with pt and advised the above recs per MW.  She states that she tried taking tramadol last night and ended up in the ER with increased SOB, slurred speech, lightheadedness, and "feeling very groggy".  Feeling better this am.  Will stop the tramadol.

## 2010-02-20 NOTE — Progress Notes (Signed)
Summary: Dexilant > change to prilosec bid otc until ov-  Phone Note Call from Patient   Caller: Patient Call For: wert Summary of Call: pt was giving samples of dexilant 60mg  does she need more samples or prescript. she is out of it Initial call taken by: Rickard Patience,  October 27, 2009 10:39 AM  Follow-up for Phone Call        called and spoke with pt.  pt was given samples of Dexilant when she was seen by MW on 10/11/2009 and told to f/u in 2 weeks. However,   Pt then saw MR for a sick visit on 10/18/2009 and was told to f/u with MW on Oct 12 with full PFTs same day.  Pt states she will keep this appt with MW but states she ran out of Dexilant samples.  Wanted to know if MW wanted to send rx to pharmacy or can she have a few more samples to last until her ov with MW.  Please advise.  Also, pt wanted to mention that her breathing has improved and her cough is "virtually gone."  Will forward message to MW to address. Arman Filter LPN  October 27, 2009 11:16 AM  she should stay on therapy then but until ov just take prilosec otc 20 mg Take one 30-60 min before first and last meals of the day  Follow-up by: Nyoka Cowden MD,  October 27, 2009 3:15 PM  Additional Follow-up for Phone Call Additional follow up Details #1::        [facility] Gweneth Dimitri RN  October 27, 2009 3:23 PM pt returned call. Tivis Ringer, CNA  October 27, 2009 4:50 PM  called and spoke with pt.  and informed her of MW's response and recs.  pt verbalized understanding and started she will pick up Prilosec 20mg  OTC to take two times a day  30 to 60 mins before first and last meal and will keep f/u appt with MW Oct 12.  Megan Reynolds LPN  October 27, 2009 5:00 PM   *** i have updated this change of meds in pt's med list.     New/Updated Medications: PRILOSEC OTC 20 MG TBEC (OMEPRAZOLE MAGNESIUM) Take 1 tablet by mouth two times a day 30 to 60 mins before first and last meal of the day

## 2010-02-20 NOTE — Miscellaneous (Signed)
Summary: Orders Update pft charges  Clinical Lists Changes  Orders: Added new Service order of Carbon Monoxide diffusing w/capacity (94720) - Signed Added new Service order of Lung Volumes (94240) - Signed Added new Service order of Spirometry (Pre & Post) (94060) - Signed 

## 2010-02-20 NOTE — Assessment & Plan Note (Signed)
Summary: Pulmonary consultation/ eval cough > cyclical cough regimen   Copy to:  Dr. Wende Bushy Primary Provider/Referring Provider:  Dr. Brandt Loosen  CC:  Pulmonary Consult.Marland Kitchen  History of Present Illness: 60 yo hispanic female never smoked no prev resp problems until summer of 2011  September 27, 2009  1st pulmonary office eval abrupt onset cough lying down around first of July also present during the day also worse talking,  asthmanex may have breathing but not cough, dry > wet, night > day,  assoc with mild sore throat, dysphagia, nonspecific cp and variable doe.   Pt denies any significant itching, sneezing,  nasal congestion or excess secretions,  fever, chills, sweats, unintended wt loss, pleuritic or exertional cp, hempoptysis, orthopnea pnd or leg swelling .  Pt also denies any obvious fluctuation in symptoms with weather or environmental change or other alleviating or aggravating factors.       Current Medications (verified): 1)  Aspirin 81 Mg  Tabs (Aspirin) .Marland Kitchen.. 1 By Mouth Daily 2)  Synthroid 125 Mcg Tabs (Levothyroxine Sodium) .Marland Kitchen.. 1 By Mouth Daily 3)  Estradil ?mg .... Take 1 Tablet By Mouth Once A Day 4)  Oxybutin ?mg .... Take 1 Tablet By Mouth Three Times A Day 5)  Simvastatin ?mg .... Take 1 Tablet By Mouth Once A Day 6)  Epipen 0.3 Mg/0.80ml Devi (Epinephrine) .... As Directed  Allergies (verified): 1)  ! Codeine 2)  ! Sulfa  Past History:  Past Medical History: atypical chest pain  normal cath feb 2010 Obese hypothyroidism hyperlipidemia Recurrent Pneumonia  Chronic cough...............................................Marland KitchenSherene Sires     - onset July 2011  Past Surgical History: partial thyroidectomy complete hysterectomy bilateral ankle surgery  Family History: Son deceased from WPW Mother deceased from liver ca sister 1- breast ca sister 2-kidney ca  Social History: son died of ventricular fibrillation with WPW widowed 2 living children Works with [employer]  Review of Systems       The patient complains of shortness of breath with activity, shortness of breath at rest, non-productive cough, chest pain, loss of appetite, weight change, difficulty swallowing, sore throat, headaches, nasal congestion/difficulty breathing through nose, and depression.  The patient denies productive cough, coughing up blood, irregular heartbeats, acid heartburn, indigestion, abdominal pain, tooth/dental problems, sneezing, itching, ear ache, anxiety, hand/feet swelling, joint stiffness or pain, rash, change in color of mucus, and fever.    Vital Signs:  Patient profile:   60 year old female Height:      61 inches Weight:      168.50 pounds O2 Sat:      99 % on Room air Temp:     98.2 degrees F oral Pulse rate:   78 / minute BP sitting:   130 / 98  (left arm) Cuff size:   regular  Vitals Entered By: Gweneth Dimitri RN (September 27, 2009 10:05 AM)  O2 Flow:  Room air CC: Pulmonary Consult. Comments Medications reviewed with patient Daytime contact number verified with patient. Gweneth Dimitri RN  September 27, 2009 10:05 AM    Physical Exam  Additional Exam:  wt 168 September 27, 2009 amb hispanic female with classic voice fatigue and pseudowheeze resolves with purse lip maneuver  HEENT: nl dentition, turbinates, and orophanx. Nl external ear canals without cough reflex NECK :  without JVD/Nodes/TM/ nl carotid upstrokes bilaterally LUNGS: no acc muscle use, clear to A and P bilaterally without cough on insp or exp maneuvers CV:  RRR  no s3 or murmur or increase in P2, no edema   ABD:  soft and nontender with nl excursion in the supine position. No bruits or organomegaly, bowel sounds nl MS:  warm without deformities, calf tenderness, cyanosis or clubbing SKIN: warm and dry without lesions   NEURO:  alert, approp, no deficits     CXR  Procedure date:  09/06/2009  Findings:      wnl  Impression & Recommendations:  Problem # 1:   COUGH (ICD-786.2)  The most common causes of chronic cough in immunocompetent adults include: upper airway cough syndrome (UACS), previously referred to as postnasal drip syndrome,  caused by variety of rhinosinus conditions; (2) asthma; (3) GERD; (4) chronic bronchitis from cigarette smoking or other inhaled environmental irritants; (5) nonasthmatic eosinophilic bronchitis; and (6) bronchiectasis. These conditions, singly or in combination, have accounted for up to 94% of the causes of chronic cough in prospective studies.  In setting of previous h/o atypical cp with neg Left Heart Cath this is almost certainly GERD  - Of the three most common causes of chronic cough, only one (GERD) can actually cause the other two (asthma, which is suggested by partial response to asthmanex, and pnds)  and perpetuate the cylce of cough inducing airway trauma, inflammation, heightened sensitivity to reflux which is prompted by the cough itself via a cyclical mechanism.  This may partially respond to steroids and look like asthma and post nasal drainage but never erradicated completely unless the cough and the secondary reflux are eliminated, preferably both at the same time.   The standardized cough guidelines recently published in Chest are a 14 step process, not a single office visit,  and are intended  to address this problem logically,  with an alogrithm dependent on response to each progressive step  to determine a specific diagnosis with  minimal addtional testing needed. Therefore if compliance is an issue this empiric standardized approach simply won't work.   See instructions for specific recommendations   Orders: Consultation Level V (16109)  Medications Added to Medication List This Visit: 1)  Estradil ?mg  .... Take 1 tablet by mouth once a day 2)  Oxybutin ?mg  .... Take 1 tablet by mouth three times a day 3)  Simvastatin ?mg  .... Take 1 tablet by mouth once a day 4)  Epipen 0.3 Mg/0.5ml Devi  (Epinephrine) .... As directed 5)  Nexium 40 Mg Cpdr (Esomeprazole magnesium) .... By mouth daily. take one half hour before eating. 6)  Pepcid 20 Mg Tabs (Famotidine) .... One at bedtime 7)  Prednisone 10 Mg Tabs (Prednisone) .... 4 each am x 2days, 2x2days, 1x2days and stop 8)  Tramadol Hcl 50 Mg Tabs (Tramadol hcl) .... One to two by mouth every 4-6 hours as needed for cough or pain  Patient Instructions: 1)  Nexium 40 mg Take  one 30-60 min before first meal of the day and Pepcid 20 mg one at bedtime 2)  Stop inhaler 3)  GERD (REFLUX)  is a common cause of respiratory symptoms. It commonly presents without heartburn and can be treated with medication, but also with lifestyle changes including avoidance of late meals, excessive alcohol, smoking cessation, and avoid fatty foods, chocolate, peppermint, colas, red wine, and acidic juices such as orange juice. NO MINT OR MENTHOL PRODUCTS SO NO COUGH DROPS  4)  USE SUGARLESS CANDY INSTEAD (jolley ranchers)  5)  NO OIL BASED VITAMINS  6)  Prednisone 4 each am x 2days, 2x2days,  1x2days and stop  7)  Take delsym two tsp every 12 hours and add tramadol 50 mg up to 2 every 4 hours to suppress the urge to cough. Swallowing water or using ice chips/non mint and menthol containing candies (such as lifesavers or sugarless jolly ranchers) are also effective.  8)  Please schedule a follow-up appointment in 2 weeks, sooner if needed  Prescriptions: TRAMADOL HCL 50 MG  TABS (TRAMADOL HCL) One to two by mouth every 4-6 hours as needed for cough or pain  #40 x 0   Entered and Authorized by:   Nyoka Cowden MD   Signed by:   Nyoka Cowden MD on 09/27/2009   Method used:   Electronically to        [facility] [address] (retail)       [phone]       [place], [place]  [address]       Ph: [phone]       Fax: [phone]   RxID:   [id number] PREDNISONE 10 MG  TABS (PREDNISONE) 4 each am x 2days, 2x2days, 1x2days  and stop  #14 x 0   Entered and Authorized by:   Nyoka Cowden MD   Signed by:   Nyoka Cowden MD on 09/27/2009   Method used:   Electronically to        [facility] [address] (retail)       [phone]       [place], [place]  [address]       Ph: [phone]       Fax: [phone]   RxID:   [id number] PEPCID 20 MG TABS (FAMOTIDINE) one at bedtime  #34 x 3   Entered and Authorized by:   Nyoka Cowden MD   Signed by:   Nyoka Cowden MD on 09/27/2009   Method used:   Electronically to        [facility] [address] (retail)       [phone]       [place], [place]  [address]       Ph: [phone]       Fax: [phone]   RxID:   [id number] NEXIUM 40 MG  CPDR (ESOMEPRAZOLE MAGNESIUM) By mouth daily. Take one half hour before eating.  #34 x 2   Entered and Authorized by:   Nyoka Cowden MD   Signed by:   Nyoka Cowden MD on 09/27/2009   Method used:   Electronically to        [facility] [address] (retail)       [phone]       [place], [place]  [address]       Ph: [phone]       Fax: [phone]   RxID:   [id number]

## 2010-02-20 NOTE — Progress Notes (Signed)
Summary: pt needs Rx for ASA  Phone Note Call from Patient Call back at [phone]   Caller: Patient Reason for Call: Talk to Nurse, Talk to Doctor Summary of Call: pt needs a Rx written for ASA so she can pay for it with her flex card Initial call taken by: Omer Jack,  June 28, 2009 12:53 PM  Follow-up for Phone Call        lmtcb Scherrie Bateman, LPN  June 29, 4399 1:45 PM PT RETURNED Rosette Reveal [phone] Judie Grieve  June 28, 2009 2:22 PM     Prescriptions: ASPIRIN 81 MG  TABS (ASPIRIN) 1 by mouth daily  #30 x 11   Entered by:   Scherrie Bateman, LPN   Authorized by:   Nathen May, MD, [facility]   Signed by:   Scherrie Bateman, LPN on 64/40/3474   Method used:   Electronically to        [facility] [address] (retail)       [phone]       [place], [place]  [address]       Ph: [phone]       Fax: [phone]   RxID:   [id number]

## 2010-02-20 NOTE — Progress Notes (Signed)
Summary: nexium too expensive > change to pantoprazole 40  Phone Note Call from Patient Call back at [facility] Phone [phone]   Caller: Patient Call For: WERT Summary of Call: NEEDS SOMETHING CHEAPER THAN NEXIUM ITS 50 DOLLARS ALSO WANTS A RX FOR DELSYM SO INSURANCE COVERS IT Initial call taken by: Lacinda Axon,  September 27, 2009 5:26 PM  Follow-up for Phone Call        Dr Sherene Sires please advise pt was seen today 09/27/09.Kandice Hams [facility]  September 27, 2009 5:36 PM ok to change to pantoprazole 40 mg before meals  ok to give prescription for delsym Follow-up by: Nyoka Cowden MD,  September 27, 2009 7:19 PM  Additional Follow-up for Phone Call Additional follow up Details #1::        called spoke with patient, advised of MW's recs as stated above.  pt verbalized her understanding.  rx sent to pt's verified pharmacy. Boone Master CNA/MA  September 28, 2009 8:56 AM     New/Updated Medications: PANTOPRAZOLE SODIUM 40 MG TBEC (PANTOPRAZOLE SODIUM) Take 1 tablet by mouth once a day before breakfast DELSYM 30 MG/5ML LQCR (DEXTROMETHORPHAN POLISTIREX) 1 teaspoon by mouth every 12 hours as needed cough  Prescriptions: DELSYM 30 MG/5ML LQCR (DEXTROMETHORPHAN POLISTIREX) 1 teaspoon by mouth every 12 hours as needed cough  #1 bottle x 3   Entered by:   Boone Master CNA/MA   Authorized by:   Nyoka Cowden MD   Signed by:   Boone Master CNA/MA on 09/28/2009   Method used:   Electronically to        [facility] [address] (retail)       [phone]       [place], [place]  [address]       Ph: [phone]       Fax: [phone]   RxID:   [id number] PANTOPRAZOLE SODIUM 40 MG TBEC (PANTOPRAZOLE SODIUM) Take 1 tablet by mouth once a day before breakfast  #30 x 6   Entered by:   Boone Master CNA/MA   Authorized by:   Nyoka Cowden MD   Signed by:   Boone Master CNA/MA on 09/28/2009   Method used:   Electronically to   [facility] [address] (retail)       [phone]       [place], [place]  [address]       Ph: [phone]       Fax: [phone]   RxID:   [id number]

## 2010-03-19 ENCOUNTER — Encounter: Payer: Self-pay | Admitting: Internal Medicine

## 2010-03-19 ENCOUNTER — Ambulatory Visit (INDEPENDENT_AMBULATORY_CARE_PROVIDER_SITE_OTHER): Payer: 59 | Admitting: Internal Medicine

## 2010-03-19 DIAGNOSIS — R079 Chest pain, unspecified: Secondary | ICD-10-CM

## 2010-03-29 NOTE — Assessment & Plan Note (Signed)
Summary: f1y per pt call/mb/jml   Visit Type:  Follow-up Referring Provider:  Dr. Wende Bushy Primary Provider:  Dr. Brandt Loosen  CC:  chest pain & SOB  occasionally.  History of Present Illness:    [name] is seen in followup for atypical chest pain. She is a mother of a son who died with Wolff-Parkinson-White syndrome relate ventricular fibrillation.  She has had no problems with chest pain She has had some problems with shortness of breath which has been thought by Dr. Sherene Sires to be reflux associated  Current Medications (verified): 1)  Levothyroxine Sodium 150 Mcg Tabs (Levothyroxine Sodium) .... Once Daily 2)  Estrace 1 Mg Tabs (Estradiol) .Marland Kitchen.. 1 By Mouth Daily 3)  Oxybutynin Chloride 5 Mg Tabs (Oxybutynin Chloride) .Marland Kitchen.. 1 By Mouth Three Times A Day 4)  Pravastatin Sodium 20 Mg Tabs (Pravastatin Sodium) .... Take One Tablet By Mouth Daily At Bedtime 5)  Epipen 0.3 Mg/0.90ml Devi (Epinephrine) .... As Directed 6)  Prenatal Plus 27-1 Mg Tabs (Prenatal Vit-Fe Fumarate-Fa) .Marland Kitchen.. 1 By Mouth Daily 7)  Prolia 60 Mg/ml Soln (Denosumab) .Marland Kitchen.. 1 Injection Every 6 Months 8)  Calcium-Vitamin D 600-200 Mg-Unit Tabs (Calcium-Vitamin D) .... Take 1 Tablet By Mouth Three Times A Day- Stopped Taking 9)  Advil 200 Mg Tabs (Ibuprofen) .... As Needed 10)  Pantoprazole Sodium 40 Mg Tbec (Pantoprazole Sodium) .... Once Daily 11)  Benzonatate 200 Mg Caps (Benzonatate) .... Three Times A Day 12)  Famotidine 20 Mg Tabs (Famotidine) .... Once Daily 13)  Aspirin 81 Mg Tbec (Aspirin) .... Take One Tablet By Mouth Daily  Allergies (verified): 1)  ! Codeine 2)  ! Sulfa 3)  ! * Tramadol 4)  ! * Pepcid 5)  ! * Pantoprazole  Vital Signs:  Patient profile:   60 year old female Height:      61 inches Weight:      169 pounds BMI:     32.05 Pulse rate:   71 / minute BP sitting:   122 / 80  (left arm) Cuff size:   regular  Vitals Entered By: Hardin Negus, RMA (March 19, 2010 3:22 PM)  Physical  Exam  General:  The patient was alert and oriented in no acute distress. HEENT Normal.  Neck veins were flat, carotids were brisk.  Lungs were clear.  Heart sounds were regular without murmurs or gallops.  Abdomen was soft with active bowel sounds. There is no clubbing cyanosis or edema. Skin Warm and dry    Impression & Recommendations:  Problem # 1:  SHORTNESS OF BREATH (SOB) (ICD-786.05) stable Her updated medication list for this problem includes:    Aspirin 81 Mg Tbec (Aspirin) .Marland Kitchen... Take one tablet by mouth daily  Problem # 2:  CHEST PAIN UNSPECIFIED (ICD-786.50) presenting noncardiac chest pain Her updated medication list for this problem includes:    Aspirin 81 Mg Tbec (Aspirin) .Marland Kitchen... Take one tablet by mouth daily  Orders: EKG w/ Interpretation (93000)  Patient Instructions: 1)  Your physician recommends that you continue on your current medications as directed. Please refer to the Current Medication list given to you today. 2)  Your physician wants you to follow-up in: YEAR WITH DR Logan Bores will receive a reminder letter in the mail two months in advance. If you don't receive a letter, please call our office to schedule the follow-up appointment.

## 2010-04-05 LAB — URINALYSIS, ROUTINE W REFLEX MICROSCOPIC
Glucose, UA: NEGATIVE mg/dL
Ketones, ur: NEGATIVE mg/dL
Protein, ur: NEGATIVE mg/dL
Specific Gravity, Urine: 1.012 (ref 1.005–1.030)

## 2010-04-06 ENCOUNTER — Telehealth: Payer: Self-pay | Admitting: Internal Medicine

## 2010-04-06 LAB — CBC
HCT: 41.6 % (ref 36.0–46.0)
Hemoglobin: 14.1 g/dL (ref 12.0–15.0)
MCH: 29.2 pg (ref 26.0–34.0)
MCV: 86.1 fL (ref 78.0–100.0)
Platelets: 236 10*3/uL (ref 150–400)
RBC: 4.83 MIL/uL (ref 3.87–5.11)
RDW: 12.9 % (ref 11.5–15.5)
WBC: 4.6 10*3/uL (ref 4.0–10.5)

## 2010-04-06 LAB — DIFFERENTIAL
Basophils Absolute: 0 10*3/uL (ref 0.0–0.1)
Monocytes Absolute: 0.3 10*3/uL (ref 0.1–1.0)
Neutro Abs: 2.2 10*3/uL (ref 1.7–7.7)

## 2010-04-06 LAB — COMPREHENSIVE METABOLIC PANEL
AST: 29 U/L (ref 0–37)
Alkaline Phosphatase: 64 U/L (ref 39–117)
Calcium: 9.2 mg/dL (ref 8.4–10.5)
Chloride: 107 mEq/L (ref 96–112)
Creatinine, Ser: 0.69 mg/dL (ref 0.4–1.2)
GFR calc non Af Amer: >60 mL/min (ref >60)
Sodium: 138 mEq/L (ref 135–145)
Total Protein: 7.8 g/dL (ref 6.0–8.3)

## 2010-04-06 LAB — POCT CARDIAC MARKERS
Troponin i, poc: <0.05 ng/mL (ref 0.00–0.09)
Troponin i, poc: <0.05 ng/mL (ref 0.00–0.09)

## 2010-04-06 LAB — LIPASE, BLOOD: Lipase: 24 U/L (ref 11–59)

## 2010-04-06 LAB — POCT I-STAT, CHEM 8
Creatinine, Ser: 0.7 mg/dL (ref 0.4–1.2)
Hemoglobin: 14.3 g/dL (ref 12.0–15.0)
Potassium: 3.7 mEq/L (ref 3.5–5.1)
Sodium: 140 mEq/L (ref 135–145)
TCO2: 26 mmol/L (ref 0–100)

## 2010-04-06 LAB — D-DIMER, QUANTITATIVE: D-Dimer, Quant: <0.22 ug/mL-FEU (ref 0.00–0.48)

## 2010-04-08 LAB — POCT URINALYSIS DIP (DEVICE)
Bilirubin Urine: NEGATIVE
Ketones, ur: NEGATIVE mg/dL
Urobilinogen, UA: 0.2 mg/dL (ref 0.0–1.0)
pH: 6.5 (ref 5.0–8.0)

## 2010-04-09 ENCOUNTER — Telehealth: Payer: Self-pay | Admitting: Internal Medicine

## 2010-04-10 NOTE — Progress Notes (Addendum)
Summary: pt claling re dr referral's from dr Graciela Husbands  Phone Note Call from Patient   Caller: Patient [phone] Reason for Call: Talk to Nurse Summary of Call: dr Graciela Husbands ref pt  to two female physicians for family practice, and a pulmonary dr, needs the names of these dr's so she can make the appts- Initial call taken by: Glynda Jaeger,  April 06, 2010 10:48 AM  Follow-up for Phone Call        [name]; wanda panosh are two primary care. they can arrange pulmonary follwoup if thie think necessary Follow-up by: Nathen May, MD, [facility],  April 06, 2010 10:01 PM     Appended Document: pt claling re dr referral's from dr Maye Hides .[name]  Appended Document: pt claling re dr referral's from dr Graciela Husbands PT AWARE./CY

## 2010-04-16 ENCOUNTER — Emergency Department (HOSPITAL_COMMUNITY): Payer: 59

## 2010-04-16 ENCOUNTER — Emergency Department (HOSPITAL_COMMUNITY)
Admission: EM | Admit: 2010-04-16 | Discharge: 2010-04-17 | Disposition: A | Discharge status: Home / Self Care | Payer: 59 | Attending: Emergency Medicine | Admitting: Emergency Medicine

## 2010-04-16 DIAGNOSIS — R110 Nausea: Secondary | ICD-10-CM | POA: Insufficient documentation

## 2010-04-16 DIAGNOSIS — Z7982 Long term (current) use of aspirin: Secondary | ICD-10-CM | POA: Insufficient documentation

## 2010-04-16 DIAGNOSIS — R1012 Left upper quadrant pain: Secondary | ICD-10-CM | POA: Insufficient documentation

## 2010-04-16 DIAGNOSIS — E7800 Pure hypercholesterolemia, unspecified: Secondary | ICD-10-CM | POA: Insufficient documentation

## 2010-04-16 DIAGNOSIS — E039 Hypothyroidism, unspecified: Secondary | ICD-10-CM | POA: Insufficient documentation

## 2010-04-16 DIAGNOSIS — R350 Frequency of micturition: Secondary | ICD-10-CM | POA: Insufficient documentation

## 2010-04-16 DIAGNOSIS — Z79899 Other long term (current) drug therapy: Secondary | ICD-10-CM | POA: Insufficient documentation

## 2010-04-16 LAB — POCT I-STAT, CHEM 8
BUN: 10 mg/dL (ref 6–23)
Calcium, Ion: 1.16 mmol/L (ref 1.12–1.32)
Hemoglobin: 15.3 g/dL — ABNORMAL HIGH (ref 12.0–15.0)
Sodium: 139 mEq/L (ref 135–145)
TCO2: 26 mmol/L (ref 0–100)

## 2010-04-16 LAB — HEPATIC FUNCTION PANEL
ALT: 39 U/L — ABNORMAL HIGH (ref 0–35)
Alkaline Phosphatase: 67 U/L (ref 39–117)
Bilirubin, Direct: <0.1 mg/dL (ref 0.0–0.3)

## 2010-04-16 LAB — CBC
Hemoglobin: 14.8 g/dL (ref 12.0–15.0)
MCH: 29.5 pg (ref 26.0–34.0)
MCHC: 34.9 g/dL (ref 30.0–36.0)
Platelets: 228 10*3/uL (ref 150–400)
RBC: 5.02 MIL/uL (ref 3.87–5.11)

## 2010-04-16 LAB — POCT CARDIAC MARKERS
Myoglobin, poc: 124 ng/mL (ref 12–200)
Troponin i, poc: <0.05 ng/mL (ref 0.00–0.09)

## 2010-04-16 LAB — DIFFERENTIAL
Basophils Absolute: 0 10*3/uL (ref 0.0–0.1)
Basophils Relative: 0 % (ref 0–1)
Eosinophils Absolute: 0.1 10*3/uL (ref 0.0–0.7)
Monocytes Relative: 11 % (ref 3–12)
Neutro Abs: 3.6 10*3/uL (ref 1.7–7.7)
Neutrophils Relative %: 65 % (ref 43–77)

## 2010-04-16 LAB — URINE MICROSCOPIC-ADD ON

## 2010-04-16 LAB — URINALYSIS, ROUTINE W REFLEX MICROSCOPIC
Glucose, UA: NEGATIVE mg/dL
Leukocytes, UA: NEGATIVE
Nitrite: POSITIVE — AB
Specific Gravity, Urine: 1.01 (ref 1.005–1.030)
pH: 7 (ref 5.0–8.0)

## 2010-04-16 MED ORDER — IOHEXOL 300 MG/ML  SOLN
100.0000 mL | Freq: Once | INTRAMUSCULAR | Status: AC | PRN
  Administered 2010-04-16: 100 mL via INTRAVENOUS

## 2010-04-17 LAB — URINE CULTURE

## 2010-04-19 NOTE — Progress Notes (Signed)
Summary: Pt returning call//LMTCB /CY  Phone Note Call from Patient Call back at [facility] Phone [phone]   Caller: Patient Summary of Call: Pt returning call Initial call taken by: Judie Grieve,  April 09, 2010 9:05 AM  Follow-up for Phone Call        Phone Call Completed PT AWARE OF 2 PMD  MDS  THAT MAY F/U WITH. Follow-up by: Scherrie Bateman, LPN,  April 09, 2010 11:58 AM

## 2010-05-08 LAB — CK TOTAL AND CKMB (NOT AT ARMC)
CK, MB: 0.6 ng/mL (ref 0.3–4.0)
CK, MB: 1.2 ng/mL (ref 0.3–4.0)
Relative Index: INVALID (ref 0.0–2.5)
Relative Index: INVALID (ref 0.0–2.5)
Total CK: 50 U/L (ref 7–177)
Total CK: 73 U/L (ref 7–177)

## 2010-05-08 LAB — CBC
HCT: 41.9 % (ref 36.0–46.0)
Hemoglobin: 13.1 g/dL (ref 12.0–15.0)
Hemoglobin: 14.8 g/dL (ref 12.0–15.0)
MCHC: 35.2 g/dL (ref 30.0–36.0)
MCV: 86.2 fL (ref 78.0–100.0)
RBC: 4.86 MIL/uL (ref 3.87–5.11)
RDW: 13.2 % (ref 11.5–15.5)
WBC: 6.8 10*3/uL (ref 4.0–10.5)

## 2010-05-08 LAB — BASIC METABOLIC PANEL
CO2: 22 mEq/L (ref 19–32)
Chloride: 106 mEq/L (ref 96–112)
GFR calc Af Amer: >60 mL/min (ref >60)
Glucose, Bld: 90 mg/dL (ref 70–99)
Sodium: 138 mEq/L (ref 135–145)

## 2010-05-08 LAB — DIFFERENTIAL
Basophils Absolute: 0 10*3/uL (ref 0.0–0.1)
Eosinophils Relative: 1 % (ref 0–5)
Lymphocytes Relative: 39 % (ref 12–46)
Lymphs Abs: 2.7 10*3/uL (ref 0.7–4.0)
Monocytes Absolute: 0.6 10*3/uL (ref 0.1–1.0)

## 2010-05-08 LAB — POCT CARDIAC MARKERS: Myoglobin, poc: 46.4 ng/mL (ref 12–200)

## 2010-05-08 LAB — COMPREHENSIVE METABOLIC PANEL
Albumin: 3.2 g/dL — ABNORMAL LOW (ref 3.5–5.2)
Alkaline Phosphatase: 68 U/L (ref 39–117)
BUN: 15 mg/dL (ref 6–23)
Creatinine, Ser: 0.67 mg/dL (ref 0.4–1.2)
GFR calc Af Amer: >60 mL/min (ref >60)
Potassium: 3.7 mEq/L (ref 3.5–5.1)
Total Protein: 6.1 g/dL (ref 6.0–8.3)

## 2010-05-08 LAB — LIPID PANEL
HDL: 50 mg/dL (ref >39)
Triglycerides: 172 mg/dL — ABNORMAL HIGH (ref <150)
VLDL: 34 mg/dL (ref 0–40)

## 2010-05-08 LAB — AMYLASE: Amylase: 75 U/L (ref 27–131)

## 2010-05-08 LAB — TROPONIN I: Troponin I: <0.01 ng/mL (ref 0.00–0.06)

## 2010-05-08 LAB — PROTIME-INR
INR: 1 (ref 0.00–1.49)
Prothrombin Time: 13.5 seconds (ref 11.6–15.2)

## 2010-05-08 LAB — ANA: Anti Nuclear Antibody(ANA): NEGATIVE

## 2010-06-04 ENCOUNTER — Inpatient Hospital Stay (INDEPENDENT_AMBULATORY_CARE_PROVIDER_SITE_OTHER)
Admission: RE | Admit: 2010-06-04 | Discharge: 2010-06-04 | Disposition: A | Discharge status: Home / Self Care | Payer: 59 | Source: Ambulatory Visit | Attending: Family Medicine | Admitting: Family Medicine

## 2010-06-04 ENCOUNTER — Emergency Department (HOSPITAL_COMMUNITY)
Admission: EM | Admit: 2010-06-04 | Discharge: 2010-06-04 | Disposition: A | Discharge status: Home / Self Care | Payer: 59 | Attending: Emergency Medicine | Admitting: Emergency Medicine

## 2010-06-04 ENCOUNTER — Emergency Department (HOSPITAL_COMMUNITY): Payer: 59

## 2010-06-04 DIAGNOSIS — R61 Generalized hyperhidrosis: Secondary | ICD-10-CM | POA: Insufficient documentation

## 2010-06-04 DIAGNOSIS — Z7982 Long term (current) use of aspirin: Secondary | ICD-10-CM | POA: Insufficient documentation

## 2010-06-04 DIAGNOSIS — R51 Headache: Secondary | ICD-10-CM

## 2010-06-04 DIAGNOSIS — R110 Nausea: Secondary | ICD-10-CM | POA: Insufficient documentation

## 2010-06-04 DIAGNOSIS — H538 Other visual disturbances: Secondary | ICD-10-CM | POA: Insufficient documentation

## 2010-06-04 DIAGNOSIS — H539 Unspecified visual disturbance: Secondary | ICD-10-CM | POA: Insufficient documentation

## 2010-06-04 DIAGNOSIS — Z79899 Other long term (current) drug therapy: Secondary | ICD-10-CM | POA: Insufficient documentation

## 2010-06-04 DIAGNOSIS — E039 Hypothyroidism, unspecified: Secondary | ICD-10-CM | POA: Insufficient documentation

## 2010-06-04 LAB — BASIC METABOLIC PANEL
BUN: 11 mg/dL (ref 6–23)
Chloride: 103 mEq/L (ref 96–112)
Creatinine, Ser: <0.47 mg/dL (ref 0.4–1.2)

## 2010-06-04 LAB — CBC
MCH: 28.5 pg (ref 26.0–34.0)
MCV: 83.7 fL (ref 78.0–100.0)
Platelets: 226 10*3/uL (ref 150–400)
RDW: 13.2 % (ref 11.5–15.5)

## 2010-06-04 LAB — DIFFERENTIAL
Eosinophils Absolute: 0.3 10*3/uL (ref 0.0–0.7)
Eosinophils Relative: 7 % — ABNORMAL HIGH (ref 0–5)
Lymphs Abs: 1.9 10*3/uL (ref 0.7–4.0)
Monocytes Absolute: 0.3 10*3/uL (ref 0.1–1.0)
Monocytes Relative: 6 % (ref 3–12)

## 2010-06-05 NOTE — Assessment & Plan Note (Signed)
[facility]                                 ON-CALL NOTE   NAME:[name], Gwendolyn Franco                        MRN:          [id number]  DATE:10/22/2009                            DOB:          Aug 14, 1950    [name] is a 60 year old female who called the answering service to  report that she was having substernal chest pain.  She said that she has  had similar chest pain in the past.  She was prescribed some kind of  medication for it, something like an NSAID, but the pain has gotten  progressively worse tonight.  She denies shortness of breath, she denies  syncope.  I told her that she should go to the ER, because there are  multiple etiologies for chest pain that are life threatening, and she  should be evaluated for this.  She agreed to this plan.     Cidney Hulett    CH/MedQ  DD: 10/22/2009  DT: 10/23/2009  Job #: [id number]

## 2010-06-05 NOTE — H&P (Signed)
NAME:  [name], [name]                 ACCOUNT NO.:  [id number]   MEDICAL RECORD NO.:  [id number]          PATIENT TYPE:  EMS   LOCATION:  [facility]                         FACILITY:  [facility]   PHYSICIAN:  Bevelyn Buckles. Bensimhon, MDDATE OF BIRTH:  April 08, 1950   DATE OF ADMISSION:  03/14/2008  DATE OF DISCHARGE:                              HISTORY & PHYSICAL   PRIMARY CARE PHYSICIAN:  Dr. Brandt Loosen in [facility].   CARDIOLOGIST:  Dr. Berton Mount.   REASON FOR ADMISSION:  Chest and back pain.   [name] is a 60 year old woman with a history of hyperlipidemia and  hypothyroidism.  She is also status post cholecystectomy.  She has no  known history of heart disease.  She did have a son who died in Jun 19, 2005  from ventricular fibrillation arrest in the setting of WPW.  She had  chest pain in 06/19/2005 and Myoview which was reportedly negative.   She saw [name] last month for followup.  At that time she was  complaining of chest pain and was thought to be noncardiac.   She has been feeling well until this afternoon while at work she had  acute onset of back pain and radiating up to her chest.  She feels like  there is a pressure or weight on her chest.  Says the pain was also in  her shoulders, upper back, and low back.  She denies diaphoresis.  She  does have some nausea, no fevers or chills.  He has not had any bright  red blood per rectum or melena.  She denies reflux symptomatology.   REVIEW OF SYSTEMS:  As per HPI and problem list, otherwise all systems  negative.   PROBLEM LIST:  1. History of chest pain with normal Myoview in Jun 19, 2005.  2. Hyperlipidemia.  3. Hypothyroidism.  4. Status post cholecystectomy.   CURRENT MEDICATIONS:  Estradiol, simvastatin and Synthroid.   ALLERGIES:  CODEINE and SULFA.   SOCIAL HISTORY:  She is widowed.  She has 2 children who live with her.  She had 1 son who died from ventricular fibrillation arrest.  Mother  died at age 27 due to cirrhosis.  There is a  questionable history of  myocardial infarction.  Father is alive at 73.  He has a history of  angina for which he has nitroglycerin but not clear that he has actually  undergone catheterization.   SOCIAL HISTORY:  She is widowed.  She does not smoke or drink alcohol.  She works for Primary school teacher work.   PHYSICAL EXAMINATION:  CONSTITUTIONAL:  She is somewhat pale appearing.  Respirations are unlabored.  VITAL SIGNS:  Her blood pressure is 90/64, heart rate 70.  HEENT is normal.  NECK:  Supple.  No obvious JVD.  Carotids 2+ bilaterally without bruits.  There is no lymphadenopathy or thyromegaly.  CARDIAC:  She is markedly tender to palpation over chest wall.  She has  a regular rate and rhythm.  No murmurs, rubs or gallops.  LUNGS:  Clear.  ABDOMEN:  Obese, nontender, nondistended.  There is no  hepatosplenomegaly, no bruits.  No masses appreciated.  She is a  markedly tender over palpation of her shoulders and low back.  EXTREMITIES:  Warm with no cyanosis, clubbing or edema.  Distal pulses  are 2+ bilaterally.  There is no rash.  There is good capillary refill.  NEURO:  She is a bit uncomfortable.  Otherwise alert and oriented x3.  Cranial nerves grossly intact.  Moves all 4 extremities without  difficulty.   EKG shows sinus rhythm with no ST-T wave abnormalities.  Rate of 71.  Labs show white count 5.8 hemoglobin 14.8, platelets of 256,000.  Sodium  138, potassium 3.7, chloride 106, bicarb 22, glucose of 90, BUN 12  creatinine 0.66.  Chest x-ray is normal.  Mediastinum is not wide.   ASSESSMENT/PLAN:  1. Chest and shoulder pain, which is reproducible on palpation.  2. History of chest pain status post normal Myoview in 2007.  3. Hyperlipidemia.  4. Status post cholecystectomy.   PLAN/DISCUSSION:  Her symptoms are quite atypical.  I doubt this is  cardiac ischemia, but I am unsure as to what is going on.  Will bring  her in for 23 hour observation for  rule-out myocardial infarction.  Will  check an amylase, lipase, D-dimer AMA.  We will also check a sed rate to  rule out possible polymyalgia rheumatica.  However, the presentation is  quite atypical for that.  Further testing will be based on the results  of her initial workup.      Bevelyn Buckles. Bensimhon, MD  Electronically Signed     DRB/MEDQ  D:  03/14/2008  T:  03/14/2008  Job:  [id number]   cc:   Dr. Nicky Pugh, MD, [facility]

## 2010-06-05 NOTE — Discharge Summary (Signed)
NAME:  [name], [name]                 ACCOUNT NO.:  [id number]   MEDICAL RECORD NO.:  [id number]          PATIENT TYPE:  INP   LOCATION:  3713                         FACILITY:  [facility]   PHYSICIAN:  Bruce R. Juanda Chance, MD, FACCDATE OF BIRTH:  1950-10-08   DATE OF ADMISSION:  03/14/2008  DATE OF DISCHARGE:  03/16/2008                               DISCHARGE SUMMARY   PRIMARY CARDIOLOGIST:  Sherryl Manges, MD.   PRIMARY MEDICAL DOCTOR:  Dr. Brandt Loosen.   DISCHARGE DIAGNOSIS:  Noncardiac chest/back pain.   SECONDARY DIAGNOSES:  1. Hyperlipidemia.  2. Hypothyroidism.  3. Status post cholecystectomy.   ALLERGIES:  1. CODEINE.  2. SULFA.   PROCEDURES PERFORMED DURING THIS HOSPITALIZATION:  EKG performed on  March 14, 2008 shows normal sinus rhythm at a rate of 72 beats per  minute.  No acute ST-T wave changes.  No significant Q-waves, normal  axes, no evidence of hypertrophy, PR 130, QRS 72 and QTc 444.  The  patient had chest x-ray on March 14, 2008, this shows no acute  findings with no significant changes from prior EKG performed on January 24, 2008.  The patient had EKG on March 15, 2008, that shows sinus  bradycardia with a rate of 59 beats per minute.  No other significant  changes from prior EKG and the patient had EKG performed on March 16, 2008, this showed a normal sinus rhythm with a rate of 76 beats per  minute and no significant changes from the EKG on March 14, 2008.  On  March 16, 2008, the patient had a cardiac catheterization that showed  normal coronaries and normal LV function.   BRIEF HISTORY OF PRESENT ILLNESS:  The patient is a 60 year old female  with a history of hyperlipidemia and hypothyroidism.  She is also status  post cholecystectomy.  She has no known history of heart disease.  She  had a son who died in 06/24/2005 from ventricular fibrillation arrest in the  setting of Wolff-Parkinson-White.  She had chest pain in 24-Jun-2005 and a  Myoview which was  reportedly negative.  She saw [name] last month for  followup and at that point, she was complaining of chest pain which was  determined to be noncardiac at that time.  She has been feeling well  until this afternoon.  While at work, she had an acute onset of back  pain radiating up to her chest.  She feels like there is a pressure away  on her chest.  Reports that the pain is also in her shoulders, upper  back, and low back.  She denies diaphoresis.  She does report mild  nausea, but no fevers or chills.  She has not had any bright red blood  per rectum or melena.  She denies reflux symptomatology.  The patient  was admitted and continued to have symptoms.  On February 23, she had a  full laboratory workup including 3 sets of negative cardiac enzymes  (please see laboratory section for rest of the results).  Due to the  patient's recurrent symptoms as well  as the compelling nature of her  chest pain during this admission, the patient was scheduled for cardiac  catheterization on February 24, as described above.  The patient  tolerated the procedure well without significant complications.  At the  time of discharge on February 24, the patient was deemed stable enough  for discharge.  She was instructed to follow up with her primary care  physician within 2 weeks and that she would not need any further cardiac  workup unless her symptoms were to change or significantly worsen.  The  patient received all of her followup instructions, all of her postcath  followup and medication instructions in oral and written form and at the  time of discharge, the patient had no questions or concerns that had not  been addressed.   DISCHARGE LABS:  WBC 6.8, HGB 13.1, HCT 37.6, PLT count 237000.  Sodium  133, potassium 3.7, chloride 103, CO2 of 24, BUN 15, creatinine 0.67,  glucose 111, total bilirubin 0.7, alkaline phosphatase 68, AST 20, ALT  19, total protein 6.1, albumin 3.2, calcium 8.6, magnesium  2.1, amylase  75, lipase 22, TSH 0.781, ANA negative, total cholesterol 183,  triglycerides 172, HDL 50, LDL 99, VLDL 34.  The patient had 3 sets of  negative cardiac enzymes.  PT 13.5, INR 1.0, D-dimer less than 0.22, and  sedimentation rate 11.   DISCHARGE MEDICATIONS:  No changes made to patient's pre-admission  medication list.   Duration of discharge encounter including physician time was 45 minutes.      Jarrett Ables, PAC      Bruce R. Juanda Chance, MD, [facility]  Electronically Signed    MS/MEDQ  D:  03/16/2008  T:  03/17/2008  Job:  [id number]   cc:   Duke Salvia, MD, [facility]  Brandt Loosen, M.D.

## 2010-06-05 NOTE — Letter (Signed)
February 18, 2008    Robert L. Foy Guadalajara, MD  [address]  [place], [place] [address]   RE:  Gwendolyn, [name]  MRN:  [id number]  /  DOB:  02-25-1950   Dear [name],   This letter is to introduce to you, [name], who has moved from [facility] to [facility] and to whom I have given your name.   Though I hope that it works out that you can follow [name], we will  go ahead and check a fasting lipid profile, and she is to call us if she  has any more problems with chest pain.   You should find my most recent note attached    Sincerely,      Duke Salvia, MD, [facility]  Electronically Signed    SCK/MedQ  DD: 02/18/2008  DT: 02/19/2008  Job #: [id number]

## 2010-06-05 NOTE — Assessment & Plan Note (Signed)
[facility]                         ELECTROPHYSIOLOGY OFFICE NOTE   NAME:[name], [name]                        MRN:          [id number]  DATE:02/18/2008                            DOB:          October 27, 1950    HISTORY OF PRESENT ILLNESS:  [name] comes in.  She is the mother of  a young man who died of ventricular fibrillation in the setting of WPW  about 3 years ago.  She has had some chest pain at that time.  Myoview  scanning was negative.  The patient has had problems with recurrent  episodes of chest pain that are nonexertional, typically lasted a couple  of minutes and then resolved.  They are described as heaviness in her  chest spreading into her jaw.   PHYSICAL EXAMINATION:  VITAL SIGNS:  Blood pressure today was 150/70,  pulse is 72.  LUNGS:  Clear.  HEART:  Regular.  EXTREMITIES:  Without edema.   Electrocardiogram today was normal with intervals of 0.14/0.07/0.42, the  axis was 77 degrees.   MEDICATIONS:  1. Prilosec 20.  2. Estradiol.  3. Synthroid 75 mcg.   IMPRESSION:  1. Atypical chest pain.  2. Mother of the child who died of Wolff-Parkinson-White related      ventricular fibrillation.   [name] is doing quite well.  I think her pain is noncardiac.  She has  moved to [facility], and I have asked her to consider establishing .     Duke Salvia, MD, [facility]  Electronically Signed    SCK/MedQ  DD: 02/18/2008  DT: 02/19/2008  Job #: [id number]   cc:   [name] L. Foy Guadalajara, M.D.

## 2010-06-07 ENCOUNTER — Telehealth: Payer: Self-pay | Admitting: Internal Medicine

## 2010-06-07 NOTE — Telephone Encounter (Signed)
LOV,12 faxed to [facility] # [phone]  06/07/10/km

## 2010-07-02 ENCOUNTER — Other Ambulatory Visit: Payer: Self-pay | Admitting: Neurology

## 2010-07-02 ENCOUNTER — Encounter: Payer: Self-pay | Admitting: Internal Medicine

## 2010-07-03 ENCOUNTER — Telehealth: Payer: Self-pay | Admitting: Internal Medicine

## 2010-07-03 NOTE — Telephone Encounter (Signed)
Pt called and stated she is on ASA 81.  Gastro doctor said that was not good for her stomach.  Can he write prescription for something that will work the same but not ASA?  Nicolette Bang on [address].  Call pt with this information as soon as possible.

## 2010-07-04 NOTE — Telephone Encounter (Signed)
Pt calling back re yesterday's message, would like a call today

## 2010-07-06 ENCOUNTER — Ambulatory Visit
Admission: RE | Admit: 2010-07-06 | Discharge: 2010-07-06 | Disposition: A | Discharge status: Home / Self Care | Payer: 59 | Source: Ambulatory Visit | Attending: Neurology | Admitting: Neurology

## 2010-07-11 ENCOUNTER — Telehealth: Payer: Self-pay | Admitting: Internal Medicine

## 2010-07-11 NOTE — Telephone Encounter (Signed)
I spoke with the patient. I apologized that she has not had a call back, but in looking back in her chart, the initial message from 07/03/10 was sent to [facility] in the [facility] system. The patient proceeded to tell me that Dr. 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 She is currently on ASA 81mg  once daily. I explained there may not be an alternative to ASA, but I would review this with [name]. 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 She does have an epi pen, but would like Dr. Odessa Fleming recommendation on an allergist. I advised I will review both issues with [name] and call her back. She is agreeable.

## 2010-07-11 NOTE — Telephone Encounter (Signed)
I have no reason that she need to take ASA so from my point of view she can stop [facility] is the only place i know steve

## 2010-07-11 NOTE — Telephone Encounter (Signed)
Pt states she has called 3 times with no reply.  She needs to change from ASA because of stomach issues.  Please call her back and she wants to know why it has taken so long to get an answer.  She also needs to see an allergist and wants to know who he would suggest.

## 2010-07-13 NOTE — Telephone Encounter (Signed)
The patient is aware of Dr. Klein's recommendations. 

## 2010-07-13 NOTE — Telephone Encounter (Signed)
Pt rtn your call and would like you to call today

## 2010-07-13 NOTE — Telephone Encounter (Signed)
LMTC

## 2010-07-31 ENCOUNTER — Other Ambulatory Visit: Payer: Self-pay | Admitting: Family Medicine

## 2010-07-31 DIAGNOSIS — M545 Low back pain: Secondary | ICD-10-CM

## 2010-08-02 ENCOUNTER — Other Ambulatory Visit: Payer: 59

## 2010-08-03 ENCOUNTER — Ambulatory Visit
Admission: RE | Admit: 2010-08-03 | Discharge: 2010-08-03 | Disposition: A | Discharge status: Home / Self Care | Payer: 59 | Source: Ambulatory Visit | Attending: Family Medicine | Admitting: Family Medicine

## 2010-08-03 DIAGNOSIS — M545 Low back pain: Secondary | ICD-10-CM

## 2010-08-15 ENCOUNTER — Telehealth: Payer: Self-pay | Admitting: Internal Medicine

## 2010-08-15 NOTE — Telephone Encounter (Signed)
All Cardiac faxed to [facility] @ [phone]  08/15/10/km

## 2010-11-06 LAB — URINALYSIS, ROUTINE W REFLEX MICROSCOPIC
Glucose, UA: NEGATIVE
Ketones, ur: NEGATIVE
Protein, ur: NEGATIVE

## 2011-03-19 ENCOUNTER — Ambulatory Visit: Payer: 59 | Admitting: Internal Medicine

## 2011-03-26 ENCOUNTER — Ambulatory Visit: Payer: 59 | Admitting: Internal Medicine

## 2011-03-29 ENCOUNTER — Encounter: Payer: Self-pay | Admitting: Internal Medicine

## 2011-03-29 ENCOUNTER — Ambulatory Visit (INDEPENDENT_AMBULATORY_CARE_PROVIDER_SITE_OTHER): Payer: 59 | Admitting: Internal Medicine

## 2011-03-29 VITALS — BP 128/78 | HR 65 | Ht 60.5 in | Wt 165.8 lb

## 2011-03-29 DIAGNOSIS — R079 Chest pain, unspecified: Secondary | ICD-10-CM

## 2011-03-29 NOTE — Assessment & Plan Note (Signed)
Stable

## 2011-03-29 NOTE — Patient Instructions (Signed)
Your physician wants you to follow-up in: 1 year with Dr. Klein. You will receive a reminder letter in the mail two months in advance. If you don't receive a letter, please call our office to schedule the follow-up appointment.  Your physician recommends that you continue on your current medications as directed. Please refer to the Current Medication list given to you today.  

## 2011-03-29 NOTE — Progress Notes (Signed)
  HPI  [name] is a 61 y.o. female Seen in followup after the death of her son from WPW.  She is well  Past Medical History  Diagnosis Date  . Hyperlipidemia   . Hypothyroidism   . History of chest pain     with normal Myoview in 2007  . SOB (shortness of breath)     Past Surgical History  Procedure Date  . Cholecystectomy     Current Outpatient Prescriptions  Medication Sig Dispense Refill  . Calcium Carbonate-Vit D-Min (CALTRATE 600+D PLUS PO) Take 600 mg by mouth 3 (three) times daily.      Marland Kitchen co-enzyme Q-10 30 MG capsule Take 30 mg by mouth 2 (two) times daily.      Marland Kitchen denosumab (PROLIA) 60 MG/ML SOLN injection Inject 60 mg into the skin every 6 (six) months. Administer in upper arm, thigh, or abdomen      . dexlansoprazole (DEXILANT) 60 MG capsule Take 60 mg by mouth daily.      Marland Kitchen EPINEPHrine (EPIPEN JR) 0.15 MG/0.3ML injection Inject 0.15 mg into the muscle as directed.      Marland Kitchen estradiol (ESTRACE) 1 MG tablet Take 1 mg by mouth daily.      Marland Kitchen levothyroxine (SYNTHROID, LEVOTHROID) 150 MCG tablet Take 150 mcg by mouth daily.      . magnesium oxide (MAG-OX) 400 MG tablet Take 200 mg by mouth daily.      Marland Kitchen omeprazole (PRILOSEC) 40 MG capsule Take 40 mg by mouth 3 (three) times daily.      . pravastatin (PRAVACHOL) 20 MG tablet Take 20 mg by mouth at bedtime.      . prenatal vitamin w/FE, FA (PRENATAL 1 + 1) 27-1 MG TABS Take 1 tablet by mouth daily.        Allergies  Allergen Reactions  . Codeine     REACTION: cardiac arrect per pt  . Famotidine     REACTION: GI upset  . Sulfonamide Derivatives     REACTION: rash, increased body temp  . Tramadol     REACTION: severe cross-reaction with codeine    Review of Systems negative except from HPI and PMH  Physical Exam BP 128/78  Pulse 65  Ht 5' 0.5" (1.537 m)  Wt 165 lb 12.8 oz (75.206 kg)  BMI 31.85 kg/m2 Well developed and nourished in no acute distress HENT normal Neck supple with JVP-flat Clear Regular rate  and rhythm, no murmurs or gallops Abd-soft with active BS No Clubbing cyanosis edema Skin-warm and dry A & Oriented  Grossly normal sensory and motor function    Assessment and  Plan

## 2011-04-09 DIAGNOSIS — R350 Frequency of micturition: Secondary | ICD-10-CM | POA: Insufficient documentation

## 2011-05-07 DIAGNOSIS — N398 Other specified disorders of urinary system: Secondary | ICD-10-CM | POA: Insufficient documentation

## 2011-05-13 ENCOUNTER — Encounter: Payer: Self-pay | Admitting: Internal Medicine

## 2011-06-12 ENCOUNTER — Telehealth: Payer: Self-pay | Admitting: Internal Medicine

## 2011-06-12 NOTE — Telephone Encounter (Signed)
Spoke with pt, aware dr Graciela Husbands and his nurse are not here today. Uncertain of who to refer pt to. She wants to have herself and children tested. Will forward to dr [employer] nurse.

## 2011-06-12 NOTE — Telephone Encounter (Signed)
Unable to reach pt or leave a message  

## 2011-06-12 NOTE — Telephone Encounter (Signed)
Pt calling re needing a  Referral to a  genetic specialist for  BRCA 1 and BRCA 2, pls call

## 2011-06-13 NOTE — Telephone Encounter (Signed)
Per [name], he does not know of a specialist for this testing. He has put in a call to Dr. Darnelle Catalan to see if he knows anyone. I have explained this to the patient. I will try to call her back next week to see if [name] has heard back. She is agreeable.

## 2011-06-20 NOTE — Telephone Encounter (Signed)
I called and spoke with Misty Stanley with the Breast Program to ask if the patient would need a referral to be tested for the [facility] gene. Per Misty Stanley, she does not need a referral. She stated she would contact the patient. I gave her the patient's contact information. I attempted to call the patient at the home # listed to make her aware, this went to a voice mail that is not accepting calls. I tried her work # as well, I did leave a voice mail I was trying to reach the patient.

## 2011-06-20 NOTE — Telephone Encounter (Signed)
Pt wants to know if we heard back from Dr. Darnelle Catalan yet and she would like a call back today please

## 2011-06-20 NOTE — Telephone Encounter (Signed)
Message     G Thanks very much H we can use this info for [employer] ----- Message ----- From: Lowella Dell, MD Sent: 06/18/2011 1:24 PM To: Duke Salvia, MD  We have a full-time genetics counselor with the Breast [facility], Maylon Cos (she is superb)--easy enough to schedule, just call [phone] and let our Lorenza Cambridge know what you want  GM ----- Message ----- From: Duke Salvia, MD Sent: 06/13/2011 5:30 PM To: Lowella Dell, MD  GUS, quit question for you. We had a query from a patient regarding genetic screening for the BRCAl 1 BRCA2 geneWhom Would you recommend that we say thanks. Brett Canales

## 2011-06-20 NOTE — Telephone Encounter (Signed)
I spoke with the patient and she is aware to expect a call from the Breast [facility].

## 2011-06-21 ENCOUNTER — Telehealth: Payer: Self-pay | Admitting: *Deleted

## 2011-06-21 NOTE — Telephone Encounter (Signed)
Confirmed 07/15/11 genetics appt w/ pt.  Pt is going to bring her son and daughter for the consultation as well.

## 2011-07-09 ENCOUNTER — Telehealth: Payer: Self-pay | Admitting: Genetic Counselor

## 2011-07-09 NOTE — Telephone Encounter (Signed)
VM did not have this patient's name on it, so I did not leave a message.

## 2011-07-09 NOTE — Telephone Encounter (Signed)
The VM box is full and is no longer accepting messages.

## 2011-07-15 ENCOUNTER — Ambulatory Visit (HOSPITAL_BASED_OUTPATIENT_CLINIC_OR_DEPARTMENT_OTHER): Payer: 59 | Admitting: Genetic Counselor

## 2011-07-15 ENCOUNTER — Other Ambulatory Visit: Payer: 59 | Admitting: Lab

## 2011-07-15 DIAGNOSIS — Z800 Family history of malignant neoplasm of digestive organs: Secondary | ICD-10-CM

## 2011-07-15 DIAGNOSIS — Z803 Family history of malignant neoplasm of breast: Secondary | ICD-10-CM

## 2011-07-15 DIAGNOSIS — Z801 Family history of malignant neoplasm of trachea, bronchus and lung: Secondary | ICD-10-CM

## 2011-07-15 DIAGNOSIS — Z809 Family history of malignant neoplasm, unspecified: Secondary | ICD-10-CM

## 2011-07-16 ENCOUNTER — Encounter: Payer: Self-pay | Admitting: Genetic Counselor

## 2011-07-16 NOTE — Progress Notes (Signed)
[name] requested a consultation for genetic counseling and risk assessment for [name], a 61 y.o. female, for discussion of her family history of cancer. She presents to clinic today to discuss the possibility of a genetic predisposition to cancer, and to further clarify her risks, as well as her family members' risks for cancer.   HISTORY OF PRESENT ILLNESS: [name] is a 61 y.o. female with no personal history of cancer.    Past Medical History  Diagnosis Date  . Hyperlipidemia   . Hypothyroidism   . History of chest pain     with normal Myoview in 2007  . SOB (shortness of breath)     Past Surgical History  Procedure Date  . Cholecystectomy   . Salpingoophorectomy 2005    Bilateral fallopian tubes and ovaries  . Abdominal hysterectomy 2005    Leiomyoma  . Thyroidectomy     Partial thyroidectomy at age 77-19; per patient, not cancer    History  Substance Use Topics  . Smoking status: Never Smoker   . Smokeless tobacco: Not on file  . Alcohol Use: No    REPRODUCTIVE HISTORY AND PERSONAL RISK ASSESSMENT FACTORS: Menarche was at age 4.   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 Ovaries Intact: No, hysterectomy in 2005 [id number] , first live birth at age 58  She has not previously undergone treatment for infertility.   OCP use for 20 years   She has taken 2 mgs Estodiol daily for 8 years.    FAMILY HISTORY:  We obtained a detailed, 4-generation family history.  Significant diagnoses are listed below: Family History  Problem Relation Age of Onset  . Cancer Mother 12    Liver cancer - did not drink alcohol  . Cancer Sister 79    breast cancer  . Cancer Brother 89    Stomach cancer  . Cancer Maternal Aunt     breast cancer in her 56s  . Cancer Maternal Uncle     liver cancer diagnosed and died in 59s  . Cancer Sister 34    Kidney cancer  .  Cancer Maternal Uncle     lung cancer in a non smoker  . Cancer Maternal Uncle     colon cancer in his 85s  . Cancer Maternal Aunt     "female cancer"  . Cancer Maternal Aunt     breast cancer in her 77s  . Heart disease Son     wolf parkinson white  . Seizures Daughter   The patient has not had cancer and is 61 years old.  She had a TAH-BSO because of a tumor, which per the path report was a leiomyoma.  She had an identical twin sister that died at birth because of a "medical mistake".  They were born at 7 months, per the patient.  She has five other sisters and four brothers.  One brother died at 8 months from pneumonia, and another brother was diagnosed with stomach cancer at age 37, and is currently 75.  A sister had breast cancer at age 85.  Another sister was diagnosed with an unknown type of kidney cancer at age 57, and is now 50.  All of the patient's sisters, except one, have reportedly had hysterectomy's for unknown reasons to the patient.  The patient's mother was diagnosed with liver cancer at age 17 and died at 58.  She has six maternal aunts and three maternal uncles.  One uncle died of liver cancer in his 57s, another uncle died of lung cancer (smoker) in his 101s, and the third brother died of either colon cancer or prostate cancer in his 52s.  Three of the maternal aunts are healthy and without cancer, one aunt had breast cancer in her 3s, another aunt had a "female cancer" in her 49s, and the third aunt cancer, possibly breast in her 49s.  The patient's maternal grandmother had an unknown cancer and died in her 54s.  There is no other reported cancer history on this side of the family.  The patient's father is alive at age 74.  He has several other children and the patient is not aware of their health. His family is all in [place] and she does not have any information about his side of the family.  Patient's maternal ancestors are of [place] and Spanish descent, and paternal ancestors  are of [place] descent. There is no reported Ashkenazi Jewish ancestry. There is  known consanguinity between the patient's maternal grandparents.  They are cousins, but unknown how close.  GENETIC COUNSELING RISK ASSESSMENT, DISCUSSION, AND SUGGESTED FOLLOW UP: We reviewed the natural history and genetic etiology of sporadic, familial and hereditary cancer syndromes.  We reviewed the red flags of hereditary cancer syndromes and the dominant inheritance pattern that typically is associated with these syndromes.  We discussed that her family history is significant for multiple cancers.  Because she is unsure of the exact type of cancer some of her aunts and uncles had, this makes the assessment more challenging.   At this time she does not meet a specific cancer syndrome criteria.   We discussed gene panels, and that several cancer genes that are associated with different cancers can be tested at the same time.  The patient was asked to contact family members and confirm the actual cancer diagnosis, age of onset/diagnosis of the cancer, type of cancer (i.e. Is the kidney cancer a clear cell, or papillary or something else), learn the type of cancer her grandmother had, and whether there is any other cancer in the family.  Because of the different types of cancer that are in the patient's family, we will consider one of the panel tests vs. a specific gene test based on the confirmation or additional family history she can obtain.  We discussed that if her genetic testing is negative,  that we could be testing for the wrong gene.  Therefore, testing a family member who has had cancer would be more informative, as they are more likely to test positive for a genetic mutation because they have cancer.  The patient's main concern is getting information for her self, as well as her children.  We also reviewed that people can get cancer just by chance.  Therefore, testing negative for a genetic mutation will let us know  that she and her children are not at increased risk for getting a cancer that is related to a cancer syndrome, but it will not ensure that she and her children would not get cancer.    The patient's family history is suggestive of the following possible diagnosis: Hereditary cancer syndrome  We discussed that identification of a hereditary cancer syndrome may help her care providers tailor the patients medical management. If a mutation indicating a hereditary cancer syndrome is detected in this case, the [employer] recommendations would include increased cancer surveillance and possibly prophylactic  surgery. If a mutation is detected, the patient will be referred back to the referring provider and to any additional appropriate care providers to discuss the relevant options.   If a mutation is not found in the patient, this will decrease the likelihood of a hereditary cancer syndrome as the explanation for her family history. Cancer surveillance options would be discussed for the patient according to the appropriate standard National Comprehensive Cancer Network and American Cancer Society guidelines, with consideration of their personal and family history risk factors. In this case, the patient will be referred back to their care providers for discussions of management.   No testing was performed today, as the patient needs to get more information on her family.  She signed consent forms for [place] to get the pathology report on her TAH-BSO (mentioned in this note), and her colonoscopies.  We will review testing again once all information is gathered.  The patient was seen for a total of 110 minutes, greater than 50% of which was spent face-to-face counseling.  This plan is being carried out per Dr. Odessa Fleming recommendations.  This note will also be sent to the referring provider via the electronic medical record. The patient will be supplied with a summary of this genetic counseling  discussion as well as educational information on the discussed hereditary cancer syndromes following the conclusion of their visit.   Patient was discussed with Dr. Drue Second.  EPIC CC: Kellie Shropshire, MD [facility]   _______________________________________________________________________ For Office Staff:  Number of people involved in session: 2 Was an Intern/ student involved with case: not applicable

## 2011-08-20 ENCOUNTER — Telehealth: Payer: Self-pay | Admitting: Internal Medicine

## 2011-08-20 NOTE — Telephone Encounter (Signed)
New msg Pt wants to know  If Dr Graciela Husbands recommend a neurologist. Please call

## 2011-08-20 NOTE — Telephone Encounter (Signed)
I spoke with the patient. She is wanting a recommendation for a neurologist for her daughter who has epilepsy. She was diagnosed at age 60 and is 33. She has not seen a neurologist. Dr. Regino Schultze is not taking any new patients as he is leaving the [facility] practice in October. I have advised her that the only other group in town is [facility]. I have given her the office number there and advised she call to inquire who may specialize in epilepsy. She voices understanding.

## 2011-09-20 ENCOUNTER — Telehealth: Payer: Self-pay | Admitting: Internal Medicine

## 2011-09-20 NOTE — Telephone Encounter (Signed)
I spoke with the patient. She only wants to talk with [name] regarding questions about her EKG's, a referral to neurology for her daughter, and other questions she would not pose to me. I advised I will give him the message and ask him to call her, but I cannot give a specific time as to when this would be. She verbalizes understanding.

## 2011-09-20 NOTE — Telephone Encounter (Signed)
Please return call to patient [phone]  She has questions about EKG

## 2011-10-01 NOTE — Telephone Encounter (Signed)
Per Dr. Klein, he called and spoke with the patient. 

## 2011-10-08 ENCOUNTER — Telehealth: Payer: Self-pay | Admitting: Internal Medicine

## 2011-10-08 NOTE — Telephone Encounter (Signed)
I had spoken with the patient about this at the end of July and given her the number of [facility]. I will forward to [name] to see if he has any specific name for her.

## 2011-10-08 NOTE — Telephone Encounter (Signed)
No specific recommmendations

## 2011-10-08 NOTE — Telephone Encounter (Signed)
Pt calling re neurologist dr Graciela Husbands would recommend

## 2011-10-08 NOTE — Telephone Encounter (Signed)
I spoke with the patient. I made her aware of Dr. Odessa Fleming recommendations. She states that the referral is for her daughter and that [name] is requesting a referral from [name]. I will order this for [name] ([birth year]). I have explained to the patient's mother that we will send her records and they will contact our office with the appointment.

## 2011-10-10 ENCOUNTER — Telehealth: Payer: Self-pay | Admitting: Genetic Counselor

## 2011-10-10 NOTE — Telephone Encounter (Signed)
Called to set up an appointment for a blood draw.  Asked for her to CB.

## 2011-10-14 ENCOUNTER — Ambulatory Visit: Payer: 59

## 2011-10-14 ENCOUNTER — Telehealth: Payer: Self-pay | Admitting: Internal Medicine

## 2011-10-14 NOTE — Telephone Encounter (Signed)
I spoke with [name] regarding her daughter [name] for which a phone note was already opened up.

## 2011-10-14 NOTE — Telephone Encounter (Signed)
Pt rtn call, didn't listen to message so not sure who called, [phone]

## 2012-01-06 ENCOUNTER — Telehealth: Payer: Self-pay | Admitting: Genetic Counselor

## 2012-01-06 NOTE — Telephone Encounter (Signed)
Revealed negative test results 

## 2012-01-08 ENCOUNTER — Encounter: Payer: Self-pay | Admitting: Genetic Counselor

## 2012-02-01 ENCOUNTER — Ambulatory Visit (INDEPENDENT_AMBULATORY_CARE_PROVIDER_SITE_OTHER): Payer: 59 | Admitting: Emergency Medicine

## 2012-02-01 VITALS — BP 102/67 | HR 87 | Temp 98.7°F | Resp 17 | Ht 61.0 in | Wt 164.8 lb

## 2012-02-01 DIAGNOSIS — IMO0002 Reserved for concepts with insufficient information to code with codable children: Secondary | ICD-10-CM

## 2012-02-01 MED ORDER — DOXYCYCLINE HYCLATE 100 MG PO CAPS: 100.0000 mg | ORAL_CAPSULE | Freq: Two times a day (BID) | ORAL | Status: DC

## 2012-02-01 MED ORDER — FLUCONAZOLE 150 MG PO TABS: 150.0000 mg | ORAL_TABLET | Freq: Once | ORAL | Status: DC

## 2012-02-01 NOTE — Progress Notes (Signed)
[facility] [address] [place] [address] [phone]  Date:  02/01/2012   Name:  [name]   DOB:  Feb 03, 1950   MRN:  [id number]  PCP:  [name]., MD    Chief Complaint: Hand Pain   History of Present Illness:  [name] is a 62 y.o. very pleasant female patient who presents with the following:  Noticed a dark spot under the base of her right thumb nail.  Now a piece of the nail has avulsed and the nail distal is split.  No history of injury or infection but has pain and tenderness in proximal nail fold.  Patient Active Problem List  Diagnosis  . HYPOTHYROIDISM  . HYPERLIPIDEMIA NEC/NOS  . SHORTNESS OF BREATH (SOB)  . COUGH  . CHEST PAIN UNSPECIFIED    Past Medical History  Diagnosis Date  . Hyperlipidemia   . Hypothyroidism   . History of chest pain     with normal Myoview in 2007  . SOB (shortness of breath)   . Allergy   . GERD (gastroesophageal reflux disease)     Past Surgical History  Procedure Date  . Cholecystectomy   . Salpingoophorectomy 2005    Bilateral fallopian tubes and ovaries  . Abdominal hysterectomy 2005    Leiomyoma  . Thyroidectomy     Partial thyroidectomy at age 39-20; per patient, not cancer  . Coronary artery bypass graft   . Colon surgery   . Fracture surgery   . Cesarean section     History  Substance Use Topics  . Smoking status: Never Smoker   . Smokeless tobacco: Not on file  . Alcohol Use: No    Family History  Problem Relation Age of Onset  . Cancer Mother 29    Liver cancer - did not drink alcohol  . Cancer Sister 31    breast cancer  . Cancer Brother 17    Stomach cancer  . Cancer Maternal Aunt     breast cancer in her 43s  . Cancer Maternal Uncle     liver cancer diagnosed and died in 28s  . Cancer Sister 56    Kidney cancer  . Cancer Maternal Uncle     lung cancer in a non smoker  . Cancer Maternal Uncle     colon cancer in his 25s  . Cancer Maternal Aunt       "female cancer"  . Cancer Maternal Aunt     breast cancer in her 50s  . Heart disease Son     wolf parkinson white  . Seizures Daughter     Allergies  Allergen Reactions  . Codeine     REACTION: cardiac arrect per pt  . Famotidine     REACTION: GI upset  . Sulfonamide Derivatives     REACTION: rash, increased body temp  . Tramadol     REACTION: severe cross-reaction with codeine    Medication list has been reviewed and updated.  Current Outpatient Prescriptions on File Prior to Visit  Medication Sig Dispense Refill  . Calcium Carbonate-Vit D-Min (CALTRATE 600+D PLUS PO) Take 600 mg by mouth 3 (three) times daily.      Marland Kitchen co-enzyme Q-10 30 MG capsule Take 30 mg by mouth 2 (two) times daily.      Marland Kitchen denosumab (PROLIA) 60 MG/ML SOLN injection Inject 60 mg into the skin every 6 (six) months. Administer in upper arm, thigh, or abdomen      .  EPINEPHrine (EPIPEN JR) 0.15 MG/0.3ML injection Inject 0.15 mg into the muscle as directed.      Marland Kitchen estradiol (ESTRACE) 1 MG tablet Take 1 mg by mouth daily.      Marland Kitchen levothyroxine (SYNTHROID, LEVOTHROID) 150 MCG tablet Take 150 mcg by mouth daily.      . magnesium oxide (MAG-OX) 400 MG tablet Take 200 mg by mouth daily.      Marland Kitchen omeprazole (PRILOSEC) 40 MG capsule Take 40 mg by mouth 3 (three) times daily.      . prenatal vitamin w/FE, FA (PRENATAL 1 + 1) 27-1 MG TABS Take 1 tablet by mouth daily.      Marland Kitchen dexlansoprazole (DEXILANT) 60 MG capsule Take 60 mg by mouth daily.      . pravastatin (PRAVACHOL) 20 MG tablet Take 20 mg by mouth at bedtime.      . [DISCONTINUED] famotidine (PEPCID) 20 MG tablet Take 20 mg by mouth daily.      . [DISCONTINUED] oxybutynin (DITROPAN) 5 MG tablet Take 5 mg by mouth 3 (three) times daily.      . [DISCONTINUED] pantoprazole (PROTONIX) 40 MG tablet Take 40 mg by mouth daily.        Review of Systems:  As per HPI, otherwise negative.    Physical Examination: Filed Vitals:   02/01/12 1802  BP: 102/67  Pulse:  87  Temp: 98.7 F (37.1 C)  Resp: 17   Filed Vitals:   02/01/12 1802  Height: 5\' 1"  (1.549 m)  Weight: 164 lb 12.8 oz (74.753 kg)   Body mass index is 31.14 kg/(m^2). Ideal Body Weight: Weight in (lb) to have BMI = 25: 132    GEN: WDWN, NAD, Non-toxic, Alert & Oriented x 3 HEENT: Atraumatic, Normocephalic.  Ears and Nose: No external deformity. EXTR: No clubbing/cyanosis/edema NEURO: Normal gait.  PSYCH: Normally interactive. Conversant. Not depressed or anxious appearing.  Calm demeanor.  RIGHT HAND:  Tender proximal nail fold with erythema and one mm long split in middle of nail.  No nail deformity or foreign body.  Minimal lymphangitis over dorsum of thumb.  NATI.  Assessment and Plan:   Paronychia doxycycline Follow up if not improved  Gwendolyn Dane, MD

## 2012-02-02 ENCOUNTER — Telehealth: Payer: Self-pay

## 2012-02-02 MED ORDER — FLUCONAZOLE 150 MG PO TABS: 150.0000 mg | ORAL_TABLET | Freq: Once | ORAL | Status: DC

## 2012-02-02 NOTE — Telephone Encounter (Signed)
Dr. Linus Salmons meds from were not sent to the pharmacy. She got the antibiotic but not the yeast infection meds. She took the antiobiotic and had reaction (stomach burn and chest hurting). Please call her, she states she was in pain all night and still hurting. Please call her at [phone]

## 2012-02-02 NOTE — Telephone Encounter (Signed)
Advised pt that we will send in diflucan again. Pt called her PCP this morning about taking the doxy. They advised her to not take with omeprazole. I also advised her to take before laying down .

## 2012-02-02 NOTE — Progress Notes (Signed)
Reviewed and agree.

## 2012-02-23 ENCOUNTER — Telehealth: Payer: Self-pay

## 2012-02-23 ENCOUNTER — Other Ambulatory Visit: Payer: Self-pay | Admitting: Emergency Medicine

## 2012-02-23 NOTE — Telephone Encounter (Signed)
PT STATES THAT SHE CALLED EARLIER TODAY IN REGARDS TO MEDICATION REFILLS AND SOMEONE WAS TO CONTACT DR Dareen Piano ABOUT THIS. PT IS STILL WAITING TO HEAR BACK ABOUT WHAT DR Dareen Piano HAS SAID.   CALLBACK: [phone]

## 2012-02-23 NOTE — Telephone Encounter (Signed)
PATIENT STATES SHE SAW DR. Dareen Piano IN January FOR AN INFECTION OF HER (L) THUMB. HE PRESCRIBED HER DOXYCYCLINE AND SHE FINISHED THE MEDICINE ABOUT 1 WEEK AGO. HER FINGER GOT BETTER, BUT LAST NIGHT IT STARTED TO HURT AGAIN. IT ALSO HAS BLOOD UNDER THE NAIL AND IS PURPLE. SHE WANTS TO KNOW IF SHE CAN GET A REFILL ON THE DOXY. OR IF SHE NEEDS TO RETURN TO BE SEEN AGAIN? I ADVISED THE PATIENT THAT IF HER FINGER HAD BLOOD UNDER THE NAIL AND IS PURPLE THAT SHE PROBABLY NEEDS TO BE SEEN AGAIN. SHE WOULD JUST LIKE TO GET A CALL BACK. BEST PHONE [phone] (CELL)    PHARMACY CHOICE IS [facility] ON [address].

## 2012-02-24 NOTE — Telephone Encounter (Signed)
Not appropriate to refill doxycyline without office visit.  Called patient to advise. The number given may not be correct, a man answered the greeting. No message left.

## 2012-02-24 NOTE — Telephone Encounter (Signed)
Pt CB and I advised her provider will need to recheck her thumb. Pt agreed and will try to come see Dr Dareen Piano tomorrow.

## 2012-03-09 ENCOUNTER — Ambulatory Visit: Payer: 59

## 2012-03-19 ENCOUNTER — Encounter (HOSPITAL_COMMUNITY): Payer: Self-pay

## 2012-03-19 ENCOUNTER — Emergency Department (HOSPITAL_COMMUNITY)
Admission: EM | Admit: 2012-03-19 | Discharge: 2012-03-20 | Disposition: A | Discharge status: Home / Self Care | Payer: 59 | Attending: Emergency Medicine | Admitting: Emergency Medicine

## 2012-03-19 ENCOUNTER — Ambulatory Visit (INDEPENDENT_AMBULATORY_CARE_PROVIDER_SITE_OTHER): Payer: 59 | Admitting: Family Medicine

## 2012-03-19 ENCOUNTER — Emergency Department (HOSPITAL_COMMUNITY): Payer: 59

## 2012-03-19 VITALS — BP 108/76 | HR 78 | Temp 98.3°F | Resp 18 | Ht 61.0 in | Wt 164.0 lb

## 2012-03-19 DIAGNOSIS — R072 Precordial pain: Secondary | ICD-10-CM | POA: Insufficient documentation

## 2012-03-19 DIAGNOSIS — R0602 Shortness of breath: Secondary | ICD-10-CM | POA: Insufficient documentation

## 2012-03-19 DIAGNOSIS — E039 Hypothyroidism, unspecified: Secondary | ICD-10-CM | POA: Insufficient documentation

## 2012-03-19 DIAGNOSIS — Z8719 Personal history of other diseases of the digestive system: Secondary | ICD-10-CM | POA: Insufficient documentation

## 2012-03-19 DIAGNOSIS — R42 Dizziness and giddiness: Secondary | ICD-10-CM

## 2012-03-19 DIAGNOSIS — E785 Hyperlipidemia, unspecified: Secondary | ICD-10-CM | POA: Insufficient documentation

## 2012-03-19 DIAGNOSIS — R079 Chest pain, unspecified: Secondary | ICD-10-CM

## 2012-03-19 DIAGNOSIS — Z79899 Other long term (current) drug therapy: Secondary | ICD-10-CM | POA: Insufficient documentation

## 2012-03-19 LAB — BASIC METABOLIC PANEL
BUN: 12 mg/dL (ref 6–23)
GFR calc Af Amer: >90 mL/min (ref >90)
GFR calc non Af Amer: >90 mL/min (ref >90)
Potassium: 3.6 mEq/L (ref 3.5–5.1)

## 2012-03-19 LAB — CBC WITH DIFFERENTIAL/PLATELET
Basophils Relative: 1 % (ref 0–1)
Eosinophils Absolute: 0.2 10*3/uL (ref 0.0–0.7)
MCH: 29.2 pg (ref 26.0–34.0)
MCHC: 34.7 g/dL (ref 30.0–36.0)
Monocytes Relative: 8 % (ref 3–12)
Neutrophils Relative %: 43 % (ref 43–77)
Platelets: 252 10*3/uL (ref 150–400)

## 2012-03-19 MED ORDER — DIPHENHYDRAMINE HCL 50 MG/ML IJ SOLN
25.0000 mg | Freq: Once | INTRAMUSCULAR | Status: AC
  Administered 2012-03-19: 25 mg via INTRAVENOUS
  Filled 2012-03-19: qty 1

## 2012-03-19 MED ORDER — KETOROLAC TROMETHAMINE 30 MG/ML IJ SOLN
30.0000 mg | Freq: Once | INTRAMUSCULAR | Status: AC
  Administered 2012-03-19: 30 mg via INTRAVENOUS
  Filled 2012-03-19: qty 1

## 2012-03-19 MED ORDER — METOCLOPRAMIDE HCL 5 MG/ML IJ SOLN
10.0000 mg | Freq: Once | INTRAMUSCULAR | Status: AC
  Administered 2012-03-19: 10 mg via INTRAVENOUS
  Filled 2012-03-19: qty 2

## 2012-03-19 NOTE — Progress Notes (Signed)
[name] is a 62 y.o. female who presents to Urgent Care today with complaints of chest pain:    1.  Chest pain:  62 yo F with PMH signficant for GERD and hyperlipidemia and family history of WPW followed by [name] who presents today with 1 hour history of crushing mid-sternal chest pressure.  Started while she was driving her car.  Gradually increased in intensity and nature to 10/10 in pain radiating through to her back.  She became nauseous and light-headed.  Had difficulty driving due to lightheadedness but drove straight here to Urgent Care.    Here, her pain registers as 8/10.  Continues to describe as non-radiating chest pressure, still with lightheadedness and nausea.  Denies any diaphoresis, shortness of breath, or vomiting.  Does have some mild TTP along sternum as well as paraspinal muscles in mid-thoracic region.  She had a negative cardiac cath in 2010.       PMH reviewed.  Past Medical History  Diagnosis Date  . Hyperlipidemia   . Hypothyroidism   . History of chest pain     with normal Myoview in 2007  . SOB (shortness of breath)   . Allergy   . GERD (gastroesophageal reflux disease)    Past Surgical History  Procedure Laterality Date  . Cholecystectomy    . Salpingoophorectomy  2005    Bilateral fallopian tubes and ovaries  . Abdominal hysterectomy  2005    Leiomyoma  . Thyroidectomy      Partial thyroidectomy at age [age]; per patient, not cancer  . Coronary artery bypass graft    . Colon surgery    . Fracture surgery    . Cesarean section      Medications reviewed. Current Outpatient Prescriptions  Medication Sig Dispense Refill  . Calcium Carbonate-Vit D-Min (CALTRATE 600+D PLUS PO) Take 600 mg by mouth 3 (three) times daily.      Marland Kitchen co-enzyme Q-10 30 MG capsule Take 30 mg by mouth 2 (two) times daily.      . Cranberry 400 MG CAPS Take by mouth.      . denosumab (PROLIA) 60 MG/ML SOLN injection Inject 60 mg into the skin every 6 (six) months.  Administer in upper arm, thigh, or abdomen      . EPINEPHrine (EPIPEN JR) 0.15 MG/0.3ML injection Inject 0.15 mg into the muscle as directed.      Marland Kitchen estradiol (ESTRACE) 1 MG tablet Take 1 mg by mouth daily.      Marland Kitchen levothyroxine (SYNTHROID, LEVOTHROID) 150 MCG tablet Take 150 mcg by mouth daily.      . magnesium oxide (MAG-OX) 400 MG tablet Take 200 mg by mouth daily.      Marland Kitchen omeprazole (PRILOSEC) 40 MG capsule Take 40 mg by mouth 3 (three) times daily.      . prenatal vitamin w/FE, FA (PRENATAL 1 + 1) 27-1 MG TABS Take 1 tablet by mouth daily.      Marland Kitchen dexlansoprazole (DEXILANT) 60 MG capsule Take 60 mg by mouth daily.      Marland Kitchen doxycycline (VIBRAMYCIN) 100 MG capsule Take 1 capsule (100 mg total) by mouth 2 (two) times daily.  20 capsule  0  . fluconazole (DIFLUCAN) 150 MG tablet Take 1 tablet (150 mg total) by mouth once. Repeat if needed  2 tablet  0  . pravastatin (PRAVACHOL) 20 MG tablet Take 20 mg by mouth at bedtime.      . [DISCONTINUED] famotidine (PEPCID) 20 MG tablet Take  20 mg by mouth daily.      . [DISCONTINUED] oxybutynin (DITROPAN) 5 MG tablet Take 5 mg by mouth 3 (three) times daily.      . [DISCONTINUED] pantoprazole (PROTONIX) 40 MG tablet Take 40 mg by mouth daily.       No current facility-administered medications for this visit.    ROS as above otherwise neg.  No chest pain, palpitations, SOB, Fever, Chills, Abd pain, N/V/D.   Physical Exam:  BP 108/76  Pulse 78  Temp(Src) 98.3 F (36.8 C) (Oral)  Resp 18  Ht 5\' 1"  (1.549 m)  Wt 164 lb (74.39 kg)  BMI 31 kg/m2  SpO2 100% Gen:  Alert, cooperative patient who appears stated age in no acute distress.  Vital signs reviewed. Head:  /AT Eyes:  PERRL, EOMI Nares:  Patent Ears:  Normal auditory acuity Neck:  No JVD, trachea midline Mouth:   MMM Pulm:  Clear to auscultation bilaterally with good air movement.  No wheezes or rales noted.   Cardiac:  Regular rate and rhythm without murmur auscultated.  Good S1/S2. Abd:   Soft/nondistended/nontender.  Good bowel sounds throughout all four quadrants.  No masses noted.  Exts: Non edematous BL  LE, warm and well perfused.  Psych:  Not depressed or anxious appearing Neuro:  Alert and oriented x 3.  No focal deficits noted.   EKG:  NSR  Assessment and Plan:  1.  Atypical chest pain: - Does not have many risk factors (no diabetes, HTN, smoking) but pain persists and is retrosternal in nature with associated nausea/lightheadedness which have not improved.   - EMS arrived almost immediately, did not provide ASA or NTG here as EMS showed up  - Plan to transfer to ED for further evaluation, rule out ACS.

## 2012-03-19 NOTE — ED Notes (Signed)
Pt. C/o midepigastric pain radiating to back with SOB, dizziness, and nausea. Pt. Given 324 ASA and nitro by EMS. Pt. Denies pain now. Hx of cardiac cath 2010.

## 2012-03-19 NOTE — ED Provider Notes (Signed)
History     CSN: [id number]  Arrival date & time 03/19/12  4098   First MD Initiated Contact with Patient 03/19/12 1915      Chief Complaint  Patient presents with  . Chest Pain    (Consider location/radiation/quality/duration/timing/severity/associated sxs/prior treatment) The history is provided by the patient.  [name] is a 62 y.o. female history of hyperlipidemia, hypothyroidism, here with chest pain. She was driving home from work and suddenly had the substernal chest pain that radiated across her entire chest. She also felt short of breath and can't catch her breath. She is on hormone replacement therapy because she had a hysterectomy for fibroids. She is not a smoker and does not have a history of hypertension or diabetes. She has hx of HL. No recent travels or hx of PE or recent travels.    Past Medical History  Diagnosis Date  . Hyperlipidemia   . Hypothyroidism   . History of chest pain     with normal Myoview in 2007  . SOB (shortness of breath)   . Allergy   . GERD (gastroesophageal reflux disease)     Past Surgical History  Procedure Laterality Date  . Cholecystectomy    . Salpingoophorectomy  2005    Bilateral fallopian tubes and ovaries  . Abdominal hysterectomy  2005    Leiomyoma  . Thyroidectomy      Partial thyroidectomy at age 46-20; per patient, not cancer  . Coronary artery bypass graft    . Colon surgery    . Fracture surgery    . Cesarean section      Family History  Problem Relation Age of Onset  . Cancer Mother 52    Liver cancer - did not drink alcohol  . Cancer Sister 79    breast cancer  . Cancer Brother 69    Stomach cancer  . Cancer Maternal Aunt     breast cancer in her 100s  . Cancer Maternal Uncle     liver cancer diagnosed and died in 39s  . Cancer Sister 80    Kidney cancer  . Cancer Maternal Uncle     lung cancer in a non smoker  . Cancer Maternal Uncle     colon cancer in his 32s  . Cancer Maternal Aunt    "female cancer"  . Cancer Maternal Aunt     breast cancer in her 24s  . Heart disease Son     wolf parkinson white  . Seizures Daughter     History  Substance Use Topics  . Smoking status: Never Smoker   . Smokeless tobacco: Not on file  . Alcohol Use: No    OB History   Grav Para Term Preterm Abortions TAB SAB Ect Mult Living                  Review of Systems  Respiratory: Positive for shortness of breath.   Cardiovascular: Positive for chest pain.  All other systems reviewed and are negative.    Allergies  Bee venom; Codeine; Morphine and related; Other; Lactose intolerance (gi); Tramadol; and Sulfonamide derivatives  Home Medications   Current Outpatient Rx  Name  Route  Sig  Dispense  Refill  . Calcium Carbonate-Vit D-Min (CALTRATE 600+D PLUS PO)   Oral   Take 600 mg by mouth 3 (three) times daily.         Marland Kitchen co-enzyme Q-10 30 MG capsule   Oral   Take 30 mg by  mouth 2 (two) times daily.         . Cranberry 400 MG CAPS   Oral   Take by mouth.         . denosumab (PROLIA) 60 MG/ML SOLN injection   Subcutaneous   Inject 60 mg into the skin every 6 (six) months. Administer in upper arm, thigh, or abdomen         . EPINEPHrine (EPIPEN 2-PAK) 0.3 mg/0.3 mL DEVI   Intramuscular   Inject 0.3 mg into the muscle once as needed (for bee venom).         Marland Kitchen estradiol (ESTRACE) 1 MG tablet   Oral   Take 1 mg by mouth daily.         Marland Kitchen levothyroxine (SYNTHROID, LEVOTHROID) 150 MCG tablet   Oral   Take 150 mcg by mouth at bedtime.          . magnesium oxide (MAG-OX) 400 MG tablet   Oral   Take 200 mg by mouth daily.         Marland Kitchen MELATONIN PO   Oral   Take 1 tablet by mouth at bedtime.         . Omega-3 Fatty Acids (FISH OIL TRIPLE STRENGTH PO)   Oral   Take 1 capsule by mouth 2 (two) times daily with a meal.         . OVER THE COUNTER MEDICATION   Apply externally   Apply 1 application topically daily as needed (for shoulder pain).          . prenatal vitamin w/FE, FA (PRENATAL 1 + 1) 27-1 MG TABS   Oral   Take 1 tablet by mouth daily.         . SELENIUM PO   Oral   Take 1 tablet by mouth 2 (two) times daily with a meal.           BP 107/49  Pulse 85  Temp(Src) 98.3 F (36.8 C) (Oral)  Resp 21  SpO2 96%  Physical Exam  Nursing note and vitals reviewed. Constitutional: She is oriented to person, place, and time. She appears well-developed and well-nourished.  HENT:  Head: Normocephalic.  Mouth/Throat: Oropharynx is clear and moist.  Eyes: Conjunctivae are normal. Pupils are equal, round, and reactive to light.  Neck: Normal range of motion. Neck supple.  Cardiovascular: Normal rate, regular rhythm and normal heart sounds.   Pulmonary/Chest: Effort normal and breath sounds normal. No respiratory distress. She has no wheezes. She has no rales.  Abdominal: Soft. Bowel sounds are normal. She exhibits no distension. There is no tenderness. There is no rebound and no guarding.  Musculoskeletal: Normal range of motion. She exhibits no edema and no tenderness.  Neurological: She is alert and oriented to person, place, and time.  Skin: Skin is warm and dry.  Psychiatric: She has a normal mood and affect. Her behavior is normal. Judgment and thought content normal.    ED Course  Procedures (including critical care time)  Labs Reviewed  BASIC METABOLIC PANEL - Abnormal; Notable for the following:    Glucose, Bld 115 (*)    All other components within normal limits  CBC WITH DIFFERENTIAL  TROPONIN I  D-DIMER, QUANTITATIVE  TROPONIN I   Dg Chest 2 View  03/19/2012  *RADIOLOGY REPORT*  Clinical Data: Chest pain  CHEST - 2 VIEW  Comparison: 09/06/2009  Findings: The heart size and mediastinal contours are within normal limits.  Both lungs are  clear.  The visualized skeletal structures are unremarkable.  IMPRESSION:  1.  No acute cardiopulmonary abnormalities   Original Report Authenticated By: Signa Kell,  M.D.      1. Chest pain   2. Shortness of breath      Date: 03/19/2012  Rate: 72  Rhythm: normal sinus rhythm  QRS Axis: normal  Intervals: normal  ST/T Wave abnormalities: nonspecific ST changes  Conduction Disutrbances:none  Narrative Interpretation:   Old EKG Reviewed: unchanged    MDM  [name] is a 62 y.o. female here with chest pain, SOB. Low risk for ACS but symptom onset prior to arrival so will get trop x 2. Low risk for PE but consider that she is on hormone replacement therapy and has some SOB so will get d-dimer. Pain free now, will monitor.   11:39 PM D-dimer neg. Trop neg x 1. Will get second troponin. Felt better now.        Richardean Canal, MD 03/19/12 [phone]

## 2012-03-19 NOTE — ED Notes (Signed)
Per EMS,  Pt. Has sudden onset chest pain with radiation to neck and back. SOB and dizziness. Given 324 ASA and 2 nitro.

## 2012-03-20 LAB — TROPONIN I: Troponin I: <0.3 ng/mL (ref <0.30)

## 2012-05-14 ENCOUNTER — Ambulatory Visit
Admission: RE | Admit: 2012-05-14 | Discharge: 2012-05-14 | Disposition: A | Discharge status: Home / Self Care | Payer: 59 | Source: Ambulatory Visit | Attending: Nurse Practitioner | Admitting: Nurse Practitioner

## 2012-05-14 ENCOUNTER — Other Ambulatory Visit: Payer: Self-pay | Admitting: Nurse Practitioner

## 2012-05-14 DIAGNOSIS — R0602 Shortness of breath: Secondary | ICD-10-CM

## 2012-05-20 ENCOUNTER — Encounter: Payer: Self-pay | Admitting: Internal Medicine

## 2012-05-20 ENCOUNTER — Ambulatory Visit (INDEPENDENT_AMBULATORY_CARE_PROVIDER_SITE_OTHER): Payer: 59 | Admitting: Internal Medicine

## 2012-05-20 VITALS — BP 115/76 | HR 79 | Ht 61.0 in | Wt 167.0 lb

## 2012-05-20 DIAGNOSIS — Z7189 Other specified counseling: Secondary | ICD-10-CM

## 2012-05-20 NOTE — Progress Notes (Signed)
Patient Care Team: Merlene Laughter. Renae Gloss, MD as PCP - General (Internal Medicine)   HPI  [name] is a 62 y.o. female Seen if followup after the death of her son with WPW  Her daughter has seizures  Past Medical History  Diagnosis Date  . Hyperlipidemia   . Hypothyroidism   . History of chest pain     with normal Myoview in 2007  . SOB (shortness of breath)   . Allergy   . GERD (gastroesophageal reflux disease)     Past Surgical History  Procedure Laterality Date  . Cholecystectomy    . Salpingoophorectomy  2005    Bilateral fallopian tubes and ovaries  . Abdominal hysterectomy  2005    Leiomyoma  . Thyroidectomy      Partial thyroidectomy at age 35-20; per patient, not cancer  . Coronary artery bypass graft    . Colon surgery    . Fracture surgery    . Cesarean section      Current Outpatient Prescriptions  Medication Sig Dispense Refill  . Calcium Carbonate-Vit D-Min (CALTRATE 600+D PLUS PO) Take 600 mg by mouth 3 (three) times daily.      Marland Kitchen co-enzyme Q-10 30 MG capsule Take 30 mg by mouth 2 (two) times daily.      . Cranberry 400 MG CAPS Take by mouth.      . denosumab (PROLIA) 60 MG/ML SOLN injection Inject 60 mg into the skin every 6 (six) months. Administer in upper arm, thigh, or abdomen      . EPINEPHrine (EPIPEN 2-PAK) 0.3 mg/0.3 mL DEVI Inject 0.3 mg into the muscle once as needed (for bee venom).      Marland Kitchen estradiol (ESTRACE) 1 MG tablet Take 1 mg by mouth daily.      Marland Kitchen levothyroxine (SYNTHROID, LEVOTHROID) 150 MCG tablet Take 150 mcg by mouth at bedtime.       . magnesium oxide (MAG-OX) 400 MG tablet Take 200 mg by mouth daily.      Marland Kitchen MELATONIN PO Take 1 tablet by mouth at bedtime.      . Omega-3 Fatty Acids (FISH OIL TRIPLE STRENGTH PO) Take 1 capsule by mouth 2 (two) times daily with a meal.      . OVER THE COUNTER MEDICATION Apply 1 application topically daily as needed (for shoulder pain).      . prenatal vitamin w/FE, FA (PRENATAL 1 + 1) 27-1 MG TABS  Take 1 tablet by mouth daily.      . SELENIUM PO Take 1 tablet by mouth 2 (two) times daily with a meal.      . [DISCONTINUED] famotidine (PEPCID) 20 MG tablet Take 20 mg by mouth daily.      . [DISCONTINUED] oxybutynin (DITROPAN) 5 MG tablet Take 5 mg by mouth 3 (three) times daily.      . [DISCONTINUED] pantoprazole (PROTONIX) 40 MG tablet Take 40 mg by mouth daily.       No current facility-administered medications for this visit.    Allergies  Allergen Reactions  . Bee Venom Anaphylaxis and Other (See Comments)    Causes paralyzation; can hear and see but cannot move or talk. Has EPI-Pen.   . Codeine Anaphylaxis and Other (See Comments)    REACTION-SHUTS DOWN ENTIRE BODY, CARDIAC ARREST.   Marland Kitchen Morphine And Related Shortness Of Breath    ENDED UP IN HOSPITAL.   . Other Other (See Comments)    ALL OPIOIDS; SHUTS DOWN ENTIRE BODY, CARDIAC ARREST.  Has pelvic mesh implanted, cannot remove, causing various problems with bladder.    . Lactose Intolerance (Gi) Other (See Comments)    GI discomfort-Only to Milk, other dairy products are fine.   . Tramadol Other (See Comments)    Unknown-Cross reaction with codeine  . Sulfonamide Derivatives Hives, Itching, Rash and Other (See Comments)    REACTION: Increased body temp, blisters all over.      Review of Systems negative except from HPI and PMH  Physical Exam BP 115/76  Pulse 79  Ht 5\' 1"  (1.549 m)  Wt 167 lb (75.751 kg)  BMI 31.57 kg/m2    Assessment and  Plan

## 2012-05-20 NOTE — Assessment & Plan Note (Signed)
We spent 35 minutes discussing life related to the death of her son from WPW etc

## 2012-05-27 ENCOUNTER — Telehealth: Payer: Self-pay | Admitting: Internal Medicine

## 2012-05-27 NOTE — Telephone Encounter (Signed)
Patient called for a referral from [name] for a grief counselor.

## 2012-05-27 NOTE — Telephone Encounter (Signed)
Spoke with pt, aware dr Graciela Husbands out this week and will discuss with him and call her back next week. Pt agreed with this plan.

## 2012-05-27 NOTE — Telephone Encounter (Signed)
New problem   Pt calling for referral for counseling

## 2012-06-01 NOTE — Telephone Encounter (Signed)
F/u     Pt calling again

## 2012-06-01 NOTE — Telephone Encounter (Signed)
Spoke with pt, aware will discuss with dr Graciela Husbands when he is in the office tomorrow.

## 2012-06-02 NOTE — Telephone Encounter (Signed)
Discussed with dr Graciela Husbands, pt given the name of patty von steen [phone], daryll hyers [phone] and harold petrini [phone]. I also let the pt know that dr Graciela Husbands knew of a couple that had lost their son in a similar way and they would be willing to talk to her if she would like. Pt states she will call these folks and think about the couple. She did want to make sure these folks were not religous based and also if they had buried a child. Will ask dr Graciela Husbands and let the pt know.

## 2012-06-02 NOTE — Telephone Encounter (Signed)
Discussed with dr Graciela Husbands, pt made aware he did ask for non-religous people and he does not know anything personal about these people.

## 2012-06-18 ENCOUNTER — Ambulatory Visit: Payer: 59 | Admitting: Pulmonary Disease

## 2012-07-01 ENCOUNTER — Observation Stay (HOSPITAL_COMMUNITY)
Admission: EM | Admit: 2012-07-01 | Discharge: 2012-07-02 | Disposition: A | Discharge status: Home / Self Care | Payer: 59 | Attending: Family Medicine | Admitting: Family Medicine

## 2012-07-01 ENCOUNTER — Emergency Department (HOSPITAL_COMMUNITY): Payer: 59

## 2012-07-01 ENCOUNTER — Encounter (HOSPITAL_COMMUNITY): Payer: Self-pay | Admitting: Emergency Medicine

## 2012-07-01 ENCOUNTER — Observation Stay (HOSPITAL_COMMUNITY): Payer: 59

## 2012-07-01 DIAGNOSIS — R079 Chest pain, unspecified: Secondary | ICD-10-CM

## 2012-07-01 DIAGNOSIS — E785 Hyperlipidemia, unspecified: Secondary | ICD-10-CM | POA: Insufficient documentation

## 2012-07-01 DIAGNOSIS — R072 Precordial pain: Principal | ICD-10-CM | POA: Insufficient documentation

## 2012-07-01 DIAGNOSIS — R0602 Shortness of breath: Secondary | ICD-10-CM

## 2012-07-01 DIAGNOSIS — Z79899 Other long term (current) drug therapy: Secondary | ICD-10-CM | POA: Insufficient documentation

## 2012-07-01 DIAGNOSIS — I200 Unstable angina: Secondary | ICD-10-CM

## 2012-07-01 DIAGNOSIS — I10 Essential (primary) hypertension: Secondary | ICD-10-CM | POA: Insufficient documentation

## 2012-07-01 DIAGNOSIS — E039 Hypothyroidism, unspecified: Secondary | ICD-10-CM

## 2012-07-01 DIAGNOSIS — R05 Cough: Secondary | ICD-10-CM

## 2012-07-01 HISTORY — DX: Angina pectoris, unspecified: I20.9

## 2012-07-01 HISTORY — DX: Pneumonia, unspecified organism: J18.9

## 2012-07-01 HISTORY — DX: Shortness of breath: R06.02

## 2012-07-01 HISTORY — DX: Sleep apnea, unspecified: G47.30

## 2012-07-01 HISTORY — DX: Essential (primary) hypertension: I10

## 2012-07-01 HISTORY — DX: Major depressive disorder, single episode, unspecified: F32.9

## 2012-07-01 HISTORY — DX: Other chronic pain: G89.29

## 2012-07-01 HISTORY — DX: Depression, unspecified: F32.A

## 2012-07-01 HISTORY — DX: Low back pain: M54.5

## 2012-07-01 LAB — BASIC METABOLIC PANEL
BUN: 14 mg/dL (ref 6–23)
Calcium: 9.1 mg/dL (ref 8.4–10.5)
Creatinine, Ser: 0.67 mg/dL (ref 0.50–1.10)
GFR calc Af Amer: >90 mL/min (ref >90)
GFR calc non Af Amer: >90 mL/min (ref >90)
Potassium: 4.1 mEq/L (ref 3.5–5.1)

## 2012-07-01 LAB — CBC
HCT: 38.6 % (ref 36.0–46.0)
MCH: 29.2 pg (ref 26.0–34.0)
MCHC: 34.2 g/dL (ref 30.0–36.0)
MCV: 85.4 fL (ref 78.0–100.0)
RDW: 13.7 % (ref 11.5–15.5)

## 2012-07-01 MED ORDER — SODIUM CHLORIDE 0.9 % IJ SOLN: 3.0000 mL | Freq: Two times a day (BID) | INTRAMUSCULAR | Status: DC

## 2012-07-01 MED ORDER — PANTOPRAZOLE SODIUM 40 MG PO TBEC
40.0000 mg | DELAYED_RELEASE_TABLET | Freq: Every day | ORAL | Status: DC
  Administered 2012-07-01 – 2012-07-02 (×2): 40 mg via ORAL
  Filled 2012-07-01 (×2): qty 1

## 2012-07-01 MED ORDER — ESTRADIOL 1 MG PO TABS
1.0000 mg | ORAL_TABLET | Freq: Every day | ORAL | Status: DC
  Administered 2012-07-02: 1 mg via ORAL
  Filled 2012-07-01: qty 1

## 2012-07-01 MED ORDER — SODIUM CHLORIDE 0.9 % IJ SOLN: 3.0000 mL | INTRAMUSCULAR | Status: DC | PRN

## 2012-07-01 MED ORDER — SODIUM CHLORIDE 0.9 % IJ SOLN
3.0000 mL | Freq: Two times a day (BID) | INTRAMUSCULAR | Status: DC
  Administered 2012-07-02: 3 mL via INTRAVENOUS

## 2012-07-01 MED ORDER — SODIUM CHLORIDE 0.9 % IV SOLN: 250.0000 mL | INTRAVENOUS | Status: DC | PRN

## 2012-07-01 MED ORDER — HEPARIN (PORCINE) IN NACL 100-0.45 UNIT/ML-% IJ SOLN
1000.0000 [IU]/h | INTRAMUSCULAR | Status: DC
  Administered 2012-07-01: 850 [IU]/h via INTRAVENOUS
  Filled 2012-07-01 (×3): qty 250

## 2012-07-01 MED ORDER — SODIUM CHLORIDE 0.9 % IV SOLN
INTRAVENOUS | Status: DC
  Administered 2012-07-01: 20:00:00 via INTRAVENOUS

## 2012-07-01 MED ORDER — ONDANSETRON HCL 4 MG/2ML IJ SOLN: 4.0000 mg | Freq: Four times a day (QID) | INTRAMUSCULAR | Status: DC | PRN

## 2012-07-01 MED ORDER — ACETAMINOPHEN 650 MG RE SUPP: 650.0000 mg | Freq: Four times a day (QID) | RECTAL | Status: DC | PRN

## 2012-07-01 MED ORDER — NITROGLYCERIN 2 % TD OINT
0.5000 [in_us] | TOPICAL_OINTMENT | Freq: Four times a day (QID) | TRANSDERMAL | Status: DC
  Administered 2012-07-01: 0.5 [in_us] via TOPICAL
  Filled 2012-07-01 (×2): qty 1

## 2012-07-01 MED ORDER — ALUM & MAG HYDROXIDE-SIMETH 200-200-20 MG/5ML PO SUSP: 30.0000 mL | Freq: Four times a day (QID) | ORAL | Status: DC | PRN

## 2012-07-01 MED ORDER — ACETAMINOPHEN 325 MG PO TABS
650.0000 mg | ORAL_TABLET | Freq: Once | ORAL | Status: AC
  Administered 2012-07-01: 650 mg via ORAL
  Filled 2012-07-01: qty 2

## 2012-07-01 MED ORDER — ACETAMINOPHEN 325 MG PO TABS
650.0000 mg | ORAL_TABLET | Freq: Four times a day (QID) | ORAL | Status: DC | PRN
  Administered 2012-07-01: 650 mg via ORAL
  Filled 2012-07-01: qty 2

## 2012-07-01 MED ORDER — NITROGLYCERIN 2 % TD OINT
1.0000 [in_us] | TOPICAL_OINTMENT | Freq: Four times a day (QID) | TRANSDERMAL | Status: DC
  Administered 2012-07-02: 1 [in_us] via TOPICAL
  Filled 2012-07-01: qty 30

## 2012-07-01 MED ORDER — IOHEXOL 350 MG/ML SOLN
100.0000 mL | Freq: Once | INTRAVENOUS | Status: AC | PRN
  Administered 2012-07-01: 80 mL via INTRAVENOUS

## 2012-07-01 MED ORDER — LEVOTHYROXINE SODIUM 150 MCG PO TABS
150.0000 ug | ORAL_TABLET | Freq: Every day | ORAL | Status: DC
  Administered 2012-07-01: 150 ug via ORAL
  Filled 2012-07-01 (×2): qty 1

## 2012-07-01 MED ORDER — ASPIRIN EC 325 MG PO TBEC
325.0000 mg | DELAYED_RELEASE_TABLET | Freq: Every day | ORAL | Status: DC
  Administered 2012-07-02: 325 mg via ORAL

## 2012-07-01 MED ORDER — SODIUM CHLORIDE 0.9 % IV SOLN
1.0000 mL/kg/h | INTRAVENOUS | Status: DC
  Administered 2012-07-02: 1 mL/kg/h via INTRAVENOUS

## 2012-07-01 MED ORDER — ONDANSETRON HCL 4 MG PO TABS: 4.0000 mg | ORAL_TABLET | Freq: Four times a day (QID) | ORAL | Status: DC | PRN

## 2012-07-01 MED ORDER — ASPIRIN 81 MG PO CHEW
324.0000 mg | CHEWABLE_TABLET | ORAL | Status: AC
  Administered 2012-07-02: 324 mg via ORAL
  Filled 2012-07-01: qty 4

## 2012-07-01 MED ORDER — HEPARIN SODIUM (PORCINE) 5000 UNIT/ML IJ SOLN
5000.0000 [IU] | Freq: Three times a day (TID) | INTRAMUSCULAR | Status: DC
  Filled 2012-07-01 (×2): qty 1

## 2012-07-01 MED ORDER — HEPARIN BOLUS VIA INFUSION
3500.0000 [IU] | Freq: Once | INTRAVENOUS | Status: AC
  Administered 2012-07-01: 3500 [IU] via INTRAVENOUS
  Filled 2012-07-01: qty 3500

## 2012-07-01 NOTE — ED Provider Notes (Signed)
History     CSN: [id number]  Arrival date & time 07/01/12  1317   First MD Initiated Contact with Patient 07/01/12 1340      Chief Complaint  Patient presents with  . Chest Pain   (Consider location/radiation/quality/duration/timing/severity/associated sxs/prior treatment) HPI Comments: Ms. Fleig is a 62 year old white female with PMH of hypothyroidism, GERD, and atypical chest pain presenting via EMS to [facility] for complaints of sudden onset substernal chest pain while walking in her office around noon today.  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  Chest pain was 10+ on pain scale, constant, associated with shortness of breath, nausea, headache, blurry vision, and light-headedness.  She denies LOC but was asked to sit down by her boss who then called EMS.  Chest pain continued until EMS arrived and improved with 325mg  ASA and Nitroglycerin x2.  Upon arrival to ED, chest pain was still present, but improved to 3-4/10 on pain scale and complaints of headache.  She has had similar episodes of chest pain in the past and apparently has had a negative myoview and a cath in 2010 but cath report not in Epic.  She is followed by [name] with cardiology and has a son who passed away several years ago from WPW.  She denies fever, chills, vomiting, abdominal pain, or leg swelling at this time.  She has baseline occasional dysuria and is followed by GYN s/p hysterectomy and apparently has some complications from mesh that she will require surgery for in the future.  She currently had an intermittent dry cough when she takes a deep breath.  She is on HRT s/p hysterectomy.  She denies any recent travel, leg pain, or swelling.    PCP: Dr. Renae Gloss  Patient is a 62 y.o. female presenting with chest pain. The history is provided by the patient. No language interpreter was used.  Chest Pain Pain location:   Substernal area and R chest Pain quality: aching and tightness   Pain radiates to:  Mid back Pain radiates to the back: yes   Pain severity:  Severe Onset quality:  Sudden Duration:  2 hours Timing:  Constant Progression:  Improving Chronicity:  Recurrent Context: breathing and movement   Context: no drug use, not eating, no intercourse, not lifting and no trauma   Context comment:  Walking at work Relieved by:  Aspirin and nitroglycerin Worsened by:  Movement Ineffective treatments:  Rest Associated symptoms: back pain, cough, dizziness, fatigue, headache, nausea, near-syncope and shortness of breath   Associated symptoms: no abdominal pain, no fever, no heartburn, no lower extremity edema, no numbness, no palpitations, no syncope and not vomiting   Cough:    Cough characteristics:  Dry and non-productive   Severity:  Mild   Onset quality:  Unable to specify   Duration:  2 hours   Timing:  Sporadic   Progression:  Unchanged   Chronicity:  New Fatigue:    Severity:  Moderate   Timing:  Constant   Progression:  Unchanged Headaches:    Severity:  Moderate   Onset quality:  Gradual   Duration:  2 hours   Timing:  Constant   Progression:  Unchanged   Chronicity:  New Nausea:    Severity:  Mild   Onset quality:  Gradual   Timing:  Constant   Progression:  Unchanged Shortness of breath:  Severity:  Severe   Onset quality:  Sudden   Timing:  Sporadic   Progression:  Resolved Risk factors: no aortic disease, no diabetes mellitus, no hypertension, not female, not pregnant, no prior DVT/PE and no smoking   Risk factors comment:  On HRT s/p hysterectomy  Past Medical History  Diagnosis Date  . Hyperlipidemia   . Hypothyroidism   . History of chest pain     with normal Myoview in 2007  . SOB (shortness of breath)   . Allergy   . GERD (gastroesophageal reflux disease)    Past Surgical History  Procedure Laterality Date  . Cholecystectomy    . Salpingoophorectomy   2005    Bilateral fallopian tubes and ovaries  . Abdominal hysterectomy  2005    Leiomyoma  . Thyroidectomy      Partial thyroidectomy at age 12-19; per patient, not cancer  . Coronary artery bypass graft    . Colon surgery    . Fracture surgery    . Cesarean section     Family History  Problem Relation Age of Onset  . Cancer Mother 16    Liver cancer - did not drink alcohol  . Cancer Sister 5    breast cancer  . Cancer Brother 39    Stomach cancer  . Cancer Maternal Aunt     breast cancer in her 4s  . Cancer Maternal Uncle     liver cancer diagnosed and died in 70s  . Cancer Sister 5    Kidney cancer  . Cancer Maternal Uncle     lung cancer in a non smoker  . Cancer Maternal Uncle     colon cancer in his 42s  . Cancer Maternal Aunt     "female cancer"  . Cancer Maternal Aunt     breast cancer in her 50s  . Heart disease Son     wolf parkinson white  . Seizures Daughter    History  Substance Use Topics  . Smoking status: Never Smoker   . Smokeless tobacco: Not on file  . Alcohol Use: No   OB History   Grav Para Term Preterm Abortions TAB SAB Ect Mult Living                 Review of Systems  Constitutional: Positive for fatigue. Negative for fever.  Respiratory: Positive for cough and shortness of breath.   Cardiovascular: Positive for chest pain and near-syncope. Negative for palpitations and syncope.  Gastrointestinal: Positive for nausea. Negative for heartburn, vomiting and abdominal pain.  Musculoskeletal: Positive for back pain.  Neurological: Positive for dizziness and headaches. Negative for numbness.   Allergies  Bee venom; Codeine; Morphine and related; Other; Lactose intolerance (gi); Tramadol; and Sulfonamide derivatives  Home Medications   Current Outpatient Rx  Name  Route  Sig  Dispense  Refill  . Calcium Carbonate-Vit D-Min (CALTRATE 600+D PLUS PO)   Oral   Take 600 mg by mouth 3 (three) times daily.         Marland Kitchen co-enzyme Q-10 30  MG capsule   Oral   Take 30 mg by mouth 2 (two) times daily.         . Cranberry 400 MG CAPS   Oral   Take 400 mg by mouth daily.          Marland Kitchen denosumab (PROLIA) 60 MG/ML SOLN injection   Subcutaneous   Inject 60 mg into the skin every 6 (six) months. Administer in upper  arm, thigh, or abdomen         . EPINEPHrine (EPIPEN 2-PAK) 0.3 mg/0.3 mL DEVI   Intramuscular   Inject 0.3 mg into the muscle once as needed (for bee venom).         Marland Kitchen estradiol (ESTRACE) 1 MG tablet   Oral   Take 1 mg by mouth daily.         Marland Kitchen levothyroxine (SYNTHROID, LEVOTHROID) 150 MCG tablet   Oral   Take 150 mcg by mouth at bedtime.          . magnesium oxide (MAG-OX) 400 MG tablet   Oral   Take 200 mg by mouth daily.         Marland Kitchen MELATONIN PO   Oral   Take 1 tablet by mouth at bedtime as needed. For sleep         . Omega-3 Fatty Acids (FISH OIL TRIPLE STRENGTH PO)   Oral   Take 1 capsule by mouth 2 (two) times daily with a meal.         . omeprazole (PRILOSEC) 20 MG capsule   Oral   Take 20 mg by mouth daily.         Marland Kitchen OVER THE COUNTER MEDICATION   Apply externally   Apply 1 application topically daily as needed (for shoulder pain).         . prenatal vitamin w/FE, FA (PRENATAL 1 + 1) 27-1 MG TABS   Oral   Take 1 tablet by mouth daily.         . SELENIUM PO   Oral   Take 1 tablet by mouth 2 (two) times daily with a meal.          BP 104/69  Pulse 71  Temp(Src) 98 F (36.7 C) (Oral)  Resp 15  Ht 5' 0.5" (1.537 m)  Wt 150 lb (68.04 kg)  BMI 28.8 kg/m2  SpO2 100%  Physical Exam  Constitutional: She is oriented to person, place, and time. She appears well-developed and well-nourished. She appears distressed.  Painful distress   HENT:  Head: Normocephalic and atraumatic.  Eyes: EOM are normal. Pupils are equal, round, and reactive to light.  Neck: Normal range of motion. Neck supple.  Cardiovascular: Normal rate, regular rhythm, normal heart sounds and  intact distal pulses.   Pulmonary/Chest: Effort normal and breath sounds normal. She exhibits tenderness.  Right sided and substernal chest tender to palpation   Abdominal: Soft. Bowel sounds are normal. She exhibits no distension. There is tenderness.  suprapubic  Musculoskeletal: Normal range of motion. She exhibits tenderness. She exhibits no edema.  Tenderness to palpation of right and substernal chest and mid back  Neurological: She is alert and oriented to person, place, and time. No cranial nerve deficit.  Skin: Skin is warm and dry. She is not diaphoretic.  Psychiatric: Her behavior is normal. Judgment and thought content normal.   ED Course  Procedures (including critical care time)  Labs Reviewed  BASIC METABOLIC PANEL - Abnormal; Notable for the following:    Glucose, Bld 104 (*)    All other components within normal limits  CBC  PRO B NATRIURETIC PEPTIDE  POCT I-STAT TROPONIN I   Dg Chest Port 1 View  07/01/2012   *RADIOLOGY REPORT*  Clinical Data: Chest pain and shortness of breath.  PORTABLE CHEST - 1 VIEW  Comparison: 05/14/2012  Findings: The heart size is mildly enlarged.  No pleural effusions identified.  Pulmonary vascular congestion  is noted.  The lung volumes are low and there is atelectasis in both lung bases.  IMPRESSION:  1.  Low lung volumes and bibasilar atelectasis. 2.  Pulmonary vascular congestion noted.   Original Report Authenticated By: Signa Kell, M.D.    Date: 07/01/2012  Rate: 61bpm  Rhythm: normal sinus rhythm  QRS Axis: normal  Intervals: normal  ST/T Wave abnormalities: t wave inversion v1  Conduction Disutrbances:none  Narrative Interpretation: 61bpm NSR  Old EKG Reviewed: 03/19/12: 72bpm, NSR, q waves inferior leads  1. Chest pain on exertion     MDM  Ms. Quirk is 62 year old female with PMH of hypothyroidism, hyperlipidemia, and  atypical chest pain presenting with sudden onset of substernal chest pain and mid back pain today.  EKG  with no ST or T wave changes suggestive of ischemia or infarction, troponin i poc x1 negative, chest pain improved to 3-4/10 on pain scale after nitroglycerin x2 and ASA by EMS.  CXR showed low lung volumes and bibasilar atelectasis and pulmonary vascular congestion.  Chest pain improved but not completely resolved.  Still complaining of soreness at this time.  Will admit for ACS concern.  Less likely PE given no tachycardia, no recent travel, and no leg edema or pain; however, did have acute onset SOB since then resolved and is on HRT.  Modified geneva score of 0.  Other differentials include MSK vs GERD.  No widening of mediastinum on CXR.    -CBC -BMET -proBNP 71.1 -Troponin i, pox x1 negative -CXR  -discussed with [facility] hospitalist for admission -nitroglycerin ointment  Discussed with Dr. Jessee Avers, MD 07/01/12 [phone]
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
## 2012-07-01 NOTE — ED Provider Notes (Signed)
Pt is 62, has prior w/u for ACS including cath 4 years ago but has had exertional CP that improved with rest and with nitroglycerin today.  On exam she has clear heart and lung sounds, no edema, and ECG which is normal by my interpretation - likley needs r/u given several risk factors despite having normal troponin and normal ECG.  I saw and evaluated the patient, reviewed the resident's note and I agree with the findings and plan.  I saw and interpreted the ECG and agree with residents interpretation of ECG  Vida Roller, MD 07/01/12 2004

## 2012-07-01 NOTE — ED Notes (Signed)
This RN attempted to call report but told the receiving nurse was discharging a pt. This RN was told that the receiving RN would call back when she was finished.

## 2012-07-01 NOTE — H&P (Signed)
[facility] History and Physical  [name] ZOX:[id number] DOB: 10/27/1950 DOA: 07/01/2012  Referring physician: Darden Palmer, MD Resident PCP: [name]., MD    Chief Complaint: Chest pain  HPI: [name] is a 62 y.o. female with past medical history of hypothyroidism, GERD and atypical chest pain. Patient brought to the hospital by EMS for chest pain. Patient said she was in the office and was sitting in her desk when she developed substernal chest pain that radiates to her back, the chest initially was 10/10 in intensity, crushing and the pain continues till she received aspirin and nitroglycerin and the pain is improved to 3/10. It is associated with some shortness of breath, nausea and headache as well as lightheadedness. Initial evaluation in the emergency department showed normal EKG, negative cardiac enzymes and pain is resolved, patient said she had some soreness like someone punched her earlier.  Review of Systems:  Constitutional: negative for anorexia, fevers and sweats Eyes: negative for irritation, redness and visual disturbance Ears, nose, mouth, throat, and face: negative for earaches, epistaxis, nasal congestion and sore throat Respiratory: negative for cough, dyspnea on exertion, sputum and wheezing Cardiovascular: negative for chest pain, dyspnea, lower extremity edema, orthopnea, palpitations and syncope Gastrointestinal: negative for abdominal pain, constipation, diarrhea, melena, nausea and vomiting Genitourinary:negative for dysuria, frequency and hematuria Hematologic/lymphatic: negative for bleeding, easy bruising and lymphadenopathy Musculoskeletal:negative for arthralgias, muscle weakness and stiff joints Neurological: negative for coordination problems, gait problems, headaches and weakness Endocrine: negative for diabetic symptoms including polydipsia, polyuria and weight loss Allergic/Immunologic: negative for anaphylaxis, hay fever and  urticaria   Past Medical History  Diagnosis Date  . Hyperlipidemia   . Hypothyroidism   . History of chest pain     with normal Myoview in 2007  . SOB (shortness of breath)   . Allergy   . GERD (gastroesophageal reflux disease)    Past Surgical History  Procedure Laterality Date  . Cholecystectomy    . Salpingoophorectomy  2005    Bilateral fallopian tubes and ovaries  . Abdominal hysterectomy  2005    Leiomyoma  . Thyroidectomy      Partial thyroidectomy at age 60-19; per patient, not cancer  . Coronary artery bypass graft    . Colon surgery    . Fracture surgery    . Cesarean section     Social History:  reports that she has never smoked. She does not have any smokeless tobacco history on file. She reports that she does not drink alcohol or use illicit drugs.   Allergies  Allergen Reactions  . Bee Venom Anaphylaxis and Other (See Comments)    Causes paralyzation; can hear and see but cannot move or talk. Has EPI-Pen.   . Codeine Anaphylaxis and Other (See Comments)    REACTION-SHUTS DOWN ENTIRE BODY, CARDIAC ARREST.   Marland Kitchen Morphine And Related Shortness Of Breath    ENDED UP IN HOSPITAL.   . Other Other (See Comments)    ALL OPIOIDS; SHUTS DOWN ENTIRE BODY, CARDIAC ARREST.  Has pelvic mesh implanted, cannot remove, causing various problems with bladder.    . Lactose Intolerance (Gi) Other (See Comments)    GI discomfort-Only to Milk, other dairy products are fine.   . Tramadol Other (See Comments)    Unknown-Cross reaction with codeine  . Sulfonamide Derivatives Hives, Itching, Rash and Other (See Comments)    REACTION: Increased body temp, blisters all over.      Family History  Problem Relation Age  of Onset  . Cancer Mother 60    Liver cancer - did not drink alcohol  . Cancer Sister 63    breast cancer  . Cancer Brother 61    Stomach cancer  . Cancer Maternal Aunt     breast cancer in her 19s  . Cancer Maternal Uncle     liver cancer diagnosed and died in  65s  . Cancer Sister 42    Kidney cancer  . Cancer Maternal Uncle     lung cancer in a non smoker  . Cancer Maternal Uncle     colon cancer in his 58s  . Cancer Maternal Aunt     "female cancer"  . Cancer Maternal Aunt     breast cancer in her 53s  . Heart disease Son     wolf parkinson white  . Seizures Daughter     Prior to Admission medications   Medication Sig Start Date End Date Taking? Authorizing Provider  Calcium Carbonate-Vit D-Min (CALTRATE 600+D PLUS PO) Take 600 mg by mouth 3 (three) times daily.   Yes Historical Provider, MD  co-enzyme Q-10 30 MG capsule Take 30 mg by mouth 2 (two) times daily.   Yes Historical Provider, MD  Cranberry 400 MG CAPS Take 400 mg by mouth daily.    Yes Historical Provider, MD  denosumab (PROLIA) 60 MG/ML SOLN injection Inject 60 mg into the skin every 6 (six) months. Administer in upper arm, thigh, or abdomen   Yes Historical Provider, MD  EPINEPHrine (EPIPEN 2-PAK) 0.3 mg/0.3 mL DEVI Inject 0.3 mg into the muscle once as needed (for bee venom).   Yes Historical Provider, MD  estradiol (ESTRACE) 1 MG tablet Take 1 mg by mouth daily.   Yes Historical Provider, MD  levothyroxine (SYNTHROID, LEVOTHROID) 150 MCG tablet Take 150 mcg by mouth at bedtime.    Yes Historical Provider, MD  magnesium oxide (MAG-OX) 400 MG tablet Take 200 mg by mouth daily.   Yes Historical Provider, MD  MELATONIN PO Take 1 tablet by mouth at bedtime as needed. For sleep   Yes Historical Provider, MD  Omega-3 Fatty Acids (FISH OIL TRIPLE STRENGTH PO) Take 1 capsule by mouth 2 (two) times daily with a meal.   Yes Historical Provider, MD  omeprazole (PRILOSEC) 20 MG capsule Take 20 mg by mouth daily.   Yes Historical Provider, MD  OVER THE COUNTER MEDICATION Apply 1 application topically daily as needed (for shoulder pain).   Yes Historical Provider, MD  prenatal vitamin w/FE, FA (PRENATAL 1 + 1) 27-1 MG TABS Take 1 tablet by mouth daily.   Yes Historical Provider, MD   SELENIUM PO Take 1 tablet by mouth 2 (two) times daily with a meal.   Yes Historical Provider, MD   Physical Exam: Filed Vitals:   07/01/12 1336 07/01/12 1430 07/01/12 1436 07/01/12 1515  BP: 110/69 93/55 93/55  104/69  Pulse:  71 69 71  Temp: 98 F (36.7 C)     TempSrc: Oral     Resp: 19 18 20 15   Height: 5' 0.5" (1.537 m)     Weight: 68.04 kg (150 lb)     SpO2: 98% 99% 100% 100%   General appearance: alert, cooperative and no distress  Head: Normocephalic, without obvious abnormality, atraumatic  Eyes: conjunctivae/corneas clear. PERRL, EOM's intact. Fundi benign.  Nose: Nares normal. Septum midline. Mucosa normal. No drainage or sinus tenderness.  Throat: lips, mucosa, and tongue normal; teeth and gums normal  Neck: Supple,  no masses, no cervical lymphadenopathy, no JVD appreciated, no meningeal signs Resp: clear to auscultation bilaterally  Chest wall: no tenderness  Cardio: regular rate and rhythm, S1, S2 normal, no murmur, click, rub or gallop  GI: soft, non-tender; bowel sounds normal; no masses, no organomegaly  Extremities: extremities normal, atraumatic, no cyanosis or edema  Skin: Skin color, texture, turgor normal. No rashes or lesions  Neurologic: Alert and oriented X 3, normal strength and tone. Normal symmetric reflexes. Normal coordination and gait  Labs on Admission:  Basic Metabolic Panel:  Recent Labs Lab 07/01/12 1348  NA 139  K 4.1  CL 105  CO2 27  GLUCOSE 104*  BUN 14  CREATININE 0.67  CALCIUM 9.1   Liver Function Tests: No results found for this basename: AST, ALT, ALKPHOS, BILITOT, PROT, ALBUMIN,  in the last 168 hours No results found for this basename: LIPASE, AMYLASE,  in the last 168 hours No results found for this basename: AMMONIA,  in the last 168 hours CBC:  Recent Labs Lab 07/01/12 1348  WBC 4.9  HGB 13.2  HCT 38.6  MCV 85.4  PLT 246   Cardiac Enzymes: No results found for this basename: CKTOTAL, CKMB, CKMBINDEX, TROPONINI,   in the last 168 hours  BNP (last 3 results)  Recent Labs  07/01/12 1349  PROBNP 71.1   CBG: No results found for this basename: GLUCAP,  in the last 168 hours  Radiological Exams on Admission: Dg Chest Port 1 View  07/01/2012   *RADIOLOGY REPORT*  Clinical Data: Chest pain and shortness of breath.  PORTABLE CHEST - 1 VIEW  Comparison: 05/14/2012  Findings: The heart size is mildly enlarged.  No pleural effusions identified.  Pulmonary vascular congestion is noted.  The lung volumes are low and there is atelectasis in both lung bases.  IMPRESSION:  1.  Low lung volumes and bibasilar atelectasis. 2.  Pulmonary vascular congestion noted.   Original Report Authenticated By: Signa Kell, M.D.    EKG: Independently reviewed.   Assessment/Plan Active Problems:   HYPOTHYROIDISM   HYPERLIPIDEMIA NEC/NOS   CHEST PAIN UNSPECIFIED   Chest pain -Typical chest pain, substernal, crushing/tightness and relieved by nitroglycerin. -Patient is chest pain-free now, first set of cardiac enzymes is negative, 12-lead EKG is negative for ischemia. -Patient reported negative cardiac cath in 2009 or 2010, she cannot remember for sure. -Rule out ACS by 3 sets of cardiac enzymes, repeat EKG in a.m. -She is on HRT, I will check CT angio of the chest to rule out PE and other pulmonary etiology. -She does not smoke, she does not have HTN, DM, PVD or history of CAD. -Consults cardiology.  Hyperlipidemia -Check fasting lipid profile in a.m.  Hypothyroidism -Check TSH, continue preadmission Synthroid.  Code Status: Full code Family Communication: Plan discussed with the patient Disposition Plan: Observation  Time spent: 70 minutes  [facility] Pager [phone]  If 7PM-7AM, please contact night-coverage [email or web address] Password [facility] 07/01/2012, 4:43 PM

## 2012-07-01 NOTE — ED Notes (Signed)
Lab at bedside

## 2012-07-01 NOTE — Consult Note (Signed)
Patient ID: [name] MRN: [id number], DOB/AGE: [birth year]   Admit date: 07/01/2012  Primary Physician: [name]., MD Primary Cardiologist: Odessa Fleming, MD   Pt. Profile:  62 y/o female with h/o atypical chest pain who presented to the ED earlier today with chest pain.  Problem List  Past Medical History  Diagnosis Date  . Hyperlipidemia   . Hypothyroidism   . History of chest pain     with normal Myoview in 2007  . Allergy   . GERD (gastroesophageal reflux disease)     Past Surgical History  Procedure Laterality Date  . Cholecystectomy    . Salpingoophorectomy  2005    Bilateral fallopian tubes and ovaries  . Abdominal hysterectomy  2005    Leiomyoma  . Thyroidectomy      Partial thyroidectomy at age 24-19; per patient, not cancer  . Colon surgery    . Fracture surgery    . Cesarean section       Allergies  Allergies  Allergen Reactions  . Bee Venom Anaphylaxis and Other (See Comments)    Causes paralyzation; can hear and see but cannot move or talk. Has EPI-Pen.   . Codeine Anaphylaxis and Other (See Comments)    REACTION-SHUTS DOWN ENTIRE BODY, CARDIAC ARREST.   Marland Kitchen Morphine And Related Shortness Of Breath    ENDED UP IN HOSPITAL.   . Other Other (See Comments)    ALL OPIOIDS; SHUTS DOWN ENTIRE BODY, CARDIAC ARREST.  Has pelvic mesh implanted, cannot remove, causing various problems with bladder.    . Lactose Intolerance (Gi) Other (See Comments)    GI discomfort-Only to Milk, other dairy products are fine.   . Tramadol Other (See Comments)    Unknown-Cross reaction with codeine  . Sulfonamide Derivatives Hives, Itching, Rash and Other (See Comments)    REACTION: Increased body temp, blisters all over.      HPI  62 y/o female with prior h/o chest pain s/p negative stress testing in 2007.  Her son had a h/o WPW with subsequent VF arrest and death.  She has subsequently been followed by [name] annually and generally has done well without  recurrent chest pain.  Today however, she was walking in a hallway at work and developed sudden onset of severe, 9/10 substernal chest squeezing and tightness radiating through to her back, associated with dyspnea and nausea.  Her supervisor called EMS and she was taken [facility] ED. En route, she was treated with sl NTG with reduction of chest pain from 9/10 to 6/10.  Over the past 5 hrs, pain has eased off further and is currently described as a 2/10 soreness.  She is otherwise stable.  ECG is nonacute and initial troponin is negative.  We've been asked to eval.  Home Medications  Prior to Admission medications   Medication Sig Start Date End Date Taking? Authorizing Provider  Calcium Carbonate-Vit D-Min (CALTRATE 600+D PLUS PO) Take 600 mg by mouth 3 (three) times daily.   Yes Historical Provider, MD  co-enzyme Q-10 30 MG capsule Take 30 mg by mouth 2 (two) times daily.   Yes Historical Provider, MD  Cranberry 400 MG CAPS Take 400 mg by mouth daily.    Yes Historical Provider, MD  denosumab (PROLIA) 60 MG/ML SOLN injection Inject 60 mg into the skin every 6 (six) months. Administer in upper arm, thigh, or abdomen   Yes Historical Provider, MD  EPINEPHrine (EPIPEN 2-PAK) 0.3 mg/0.3 mL DEVI Inject 0.3 mg  into the muscle once as needed (for bee venom).   Yes Historical Provider, MD  estradiol (ESTRACE) 1 MG tablet Take 1 mg by mouth daily.   Yes Historical Provider, MD  levothyroxine (SYNTHROID, LEVOTHROID) 150 MCG tablet Take 150 mcg by mouth at bedtime.    Yes Historical Provider, MD  magnesium oxide (MAG-OX) 400 MG tablet Take 200 mg by mouth daily.   Yes Historical Provider, MD  MELATONIN PO Take 1 tablet by mouth at bedtime as needed. For sleep   Yes Historical Provider, MD  Omega-3 Fatty Acids (FISH OIL TRIPLE STRENGTH PO) Take 1 capsule by mouth 2 (two) times daily with a meal.   Yes Historical Provider, MD  omeprazole (PRILOSEC) 20 MG capsule Take 20 mg by mouth daily.   Yes Historical  Provider, MD  OVER THE COUNTER MEDICATION Apply 1 application topically daily as needed (for shoulder pain).   Yes Historical Provider, MD  prenatal vitamin w/FE, FA (PRENATAL 1 + 1) 27-1 MG TABS Take 1 tablet by mouth daily.   Yes Historical Provider, MD  SELENIUM PO Take 1 tablet by mouth 2 (two) times daily with a meal.   Yes Historical Provider, MD    Family History  Family History  Problem Relation Age of Onset  . Cancer Mother 62    Liver cancer - did not drink alcohol  . Cancer Sister 52    breast cancer  . Cancer Brother 23    Stomach cancer  . Cancer Maternal Aunt     breast cancer in her 2s  . Cancer Maternal Uncle     liver cancer diagnosed and died in 33s  . Cancer Sister 71    Kidney cancer  . Cancer Maternal Uncle     lung cancer in a non smoker  . Cancer Maternal Uncle     colon cancer in his 19s  . Cancer Maternal Aunt     "female cancer"  . Cancer Maternal Aunt     breast cancer in her 71s  . Heart disease Son     wolf parkinson white  . Seizures Daughter     Social History  History   Social History  . Marital Status: Widowed    Spouse Name: N/A    Number of Children: N/A  . Years of Education: N/A   Occupational History  . Not on file.   Social History Main Topics  . Smoking status: Never Smoker   . Smokeless tobacco: Not on file  . Alcohol Use: No  . Drug Use: No  . Sexually Active: No     Comment: Widow for 20 years   Other Topics Concern  . Not on file   Social History Narrative   Widowed.       Review of Systems General:  No chills, fever, night sweats or weight changes.  Cardiovascular:  +++ chest pain with dyspnea and nausea as outlined above.  No edema, orthopnea, palpitations, paroxysmal nocturnal dyspnea. Dermatological: No rash, lesions/masses Respiratory: No cough, dyspnea Urologic: No hematuria, dysuria Abdominal:   No nausea, vomiting, diarrhea, bright red blood per rectum, melena, or hematemesis Neurologic:  No  visual changes, wkns, changes in mental status. All other systems reviewed and are otherwise negative except as noted above.  Physical Exam  Blood pressure 113/73, pulse 61, temperature 98 F (36.7 C), temperature source Oral, resp. rate 15, height 5' 0.5" (1.537 m), weight 150 lb (68.04 kg), SpO2 96.00%.  General: Pleasant, NAD Psych: Normal affect.  Neuro: Alert and oriented X 3. Moves all extremities spontaneously. HEENT: Normal  Neck: Supple without bruits or JVD. Lungs:  Resp regular and unlabored, CTA. Heart: RRR no s3, s4, or murmurs. Abdomen: Soft, non-tender, non-distended, BS + x 4.  Extremities: No clubbing, cyanosis or edema. DP/PT/Radials 2+ and equal bilaterally.  Labs  Trop i, poc: 0.00  pBNP 71.1  Lab Results  Component Value Date   WBC 4.9 07/01/2012   HGB 13.2 07/01/2012   HCT 38.6 07/01/2012   MCV 85.4 07/01/2012   PLT 246 07/01/2012    Recent Labs Lab 07/01/12 1348  NA 139  K 4.1  CL 105  CO2 27  BUN 14  CREATININE 0.67  CALCIUM 9.1  GLUCOSE 104*    Radiology/Studies  Dg Chest Port 1 View  07/01/2012   *RADIOLOGY REPORT*  Clinical Data: Chest pain and shortness of breath.  PORTABLE CHEST - 1 VIEW  Comparison: 05/14/2012  Findings: The heart size is mildly enlarged.  No pleural effusions identified.  Pulmonary vascular congestion is noted.  The lung volumes are low and there is atelectasis in both lung bases.  IMPRESSION:  1.  Low lung volumes and bibasilar atelectasis. 2.  Pulmonary vascular congestion noted.   Original Report Authenticated By: Signa Kell, M.D.   ECG  Rsr, 74, early repol, no acute st/t changes.  ASSESSMENT AND PLAN  1.  [place]:  Pt presented following an episode of severe chest pain that was nitrate responsive.  She is currently comfortable though continues to report mild chest soreness.  ECG is nonacute and initial troponin was normal.  Agree with admission, cycling CE.  She is scheduled for a CTA of the chest.  Provided that  this is negative, we will plan on diagnostic cath in the AM to r/o obstructive CAD.  As she continues to report c/p, we will add heparin and nitro.  Add asa.  Lipids pending (LDL 99 in 02/2008).  Signed, Nicolasa Ducking, NP 07/01/2012, 5:18 PM  Patient seen and examined independently. Gilford Raid, NP note reviewed carefully - agree with his assessment and plan. I have edited the note based on my findings. CP is very concerning for unstable angina. Fortunately  ECG and CE are negative so far. CT chest also negative for PE. Discussed risk/indications of cath and she wants to proceed. I have discussed with [name] who knows her well and agrees.   Reuel Boom Bensimhon,MD 11:33 PM

## 2012-07-01 NOTE — ED Notes (Signed)
BIB [facility]. At work with sudden onset of new CP (1200). Hx of same. CP center of chest radiating from back. Left arm heaviness. "squeezing". Mild SOB.  EMS: 20g L AC, ASA 324mg . Nitro x2

## 2012-07-02 ENCOUNTER — Encounter (HOSPITAL_COMMUNITY)
Admission: EM | Disposition: A | Discharge status: Home / Self Care | Payer: Self-pay | Source: Home / Self Care | Attending: Emergency Medicine

## 2012-07-02 ENCOUNTER — Encounter: Payer: Self-pay | Admitting: Family Medicine

## 2012-07-02 DIAGNOSIS — R072 Precordial pain: Secondary | ICD-10-CM

## 2012-07-02 HISTORY — PX: LEFT HEART CATHETERIZATION WITH CORONARY ANGIOGRAM: SHX5451

## 2012-07-02 LAB — TROPONIN I
Troponin I: <0.3 ng/mL (ref <0.30)
Troponin I: <0.3 ng/mL (ref <0.30)

## 2012-07-02 LAB — CBC
HCT: 37.5 % (ref 36.0–46.0)
Hemoglobin: 13.1 g/dL (ref 12.0–15.0)
MCV: 84.5 fL (ref 78.0–100.0)
RBC: 4.44 MIL/uL (ref 3.87–5.11)
RDW: 13.7 % (ref 11.5–15.5)
WBC: 5.8 10*3/uL (ref 4.0–10.5)

## 2012-07-02 LAB — LIPID PANEL
Cholesterol: 183 mg/dL (ref 0–200)
HDL: 63 mg/dL (ref >39)
LDL Cholesterol: 90 mg/dL (ref 0–99)
Triglycerides: 151 mg/dL — ABNORMAL HIGH (ref <150)
VLDL: 30 mg/dL (ref 0–40)

## 2012-07-02 LAB — HEPARIN LEVEL (UNFRACTIONATED): Heparin Unfractionated: 0.25 IU/mL — ABNORMAL LOW (ref 0.30–0.70)

## 2012-07-02 LAB — BASIC METABOLIC PANEL
CO2: 23 mEq/L (ref 19–32)
Chloride: 107 mEq/L (ref 96–112)
Creatinine, Ser: 0.74 mg/dL (ref 0.50–1.10)
GFR calc Af Amer: >90 mL/min (ref >90)
Potassium: 3.8 mEq/L (ref 3.5–5.1)
Sodium: 140 mEq/L (ref 135–145)

## 2012-07-02 SURGERY — LEFT HEART CATHETERIZATION WITH CORONARY ANGIOGRAM
Anesthesia: LOCAL

## 2012-07-02 MED ORDER — NITROGLYCERIN 0.4 MG SL SUBL
SUBLINGUAL_TABLET | SUBLINGUAL | Status: AC
  Administered 2012-07-02: 11:00:00
  Filled 2012-07-02: qty 25

## 2012-07-02 MED ORDER — HEPARIN (PORCINE) IN NACL 2-0.9 UNIT/ML-% IJ SOLN
INTRAMUSCULAR | Status: AC
  Filled 2012-07-02: qty 1000

## 2012-07-02 MED ORDER — HEPARIN SODIUM (PORCINE) 1000 UNIT/ML IJ SOLN
INTRAMUSCULAR | Status: AC
  Filled 2012-07-02: qty 1

## 2012-07-02 MED ORDER — MIDAZOLAM HCL 5 MG/5ML IJ SOLN
INTRAMUSCULAR | Status: AC
  Filled 2012-07-02: qty 5

## 2012-07-02 MED ORDER — LIDOCAINE HCL (PF) 1 % IJ SOLN
INTRAMUSCULAR | Status: AC
  Filled 2012-07-02: qty 30

## 2012-07-02 MED ORDER — SODIUM CHLORIDE 0.9 % IV SOLN: 1.0000 mL/kg/h | INTRAVENOUS | Status: DC

## 2012-07-02 MED ORDER — VERAPAMIL HCL 2.5 MG/ML IV SOLN
INTRAVENOUS | Status: AC
  Filled 2012-07-02: qty 2

## 2012-07-02 NOTE — Progress Notes (Signed)
ANTICOAGULATION CONSULT NOTE - Follow Up Consult  Pharmacy Consult for Heparin Indication: chest pain/ACS  Allergies  Allergen Reactions  . Bee Venom Anaphylaxis and Other (See Comments)    Causes paralyzation; can hear and see but cannot move or talk. Has EPI-Pen.   . Codeine Anaphylaxis and Other (See Comments)    REACTION-SHUTS DOWN ENTIRE BODY, CARDIAC ARREST.   Marland Kitchen Morphine And Related Shortness Of Breath    ENDED UP IN HOSPITAL.   . Other Other (See Comments)    ALL OPIOIDS; SHUTS DOWN ENTIRE BODY, CARDIAC ARREST.  Has pelvic mesh implanted, cannot remove, causing various problems with bladder.    . Lactose Intolerance (Gi) Other (See Comments)    GI discomfort-Only to Milk, other dairy products are fine.   . Tramadol Other (See Comments)    Unknown-Cross reaction with codeine  . Sulfonamide Derivatives Hives, Itching, Rash and Other (See Comments)    REACTION: Increased body temp, blisters all over.      Patient Measurements: Height: 5' (152.4 cm) Weight: 160 lb 1.6 oz (72.621 kg) IBW/kg (Calculated) : 45.5 Heparin Dosing Weight:   Vital Signs: Temp: 98.3 F (36.8 C) (06/12 0525) Temp src: Oral (06/12 0525) BP: 110/66 mmHg (06/12 0525) Pulse Rate: 61 (06/12 0525)  Labs:  Recent Labs  07/01/12 1348 07/01/12 1920 07/02/12 0020 07/02/12 0740  HGB 13.2  --   --  13.1  HCT 38.6  --   --  37.5  PLT 246  --   --  241  LABPROT  --   --   --  13.0  INR  --   --   --  0.99  HEPARINUNFRC  --   --   --  0.33  CREATININE 0.67  --   --  0.74  TROPONINI  --  <0.30 <0.30 <0.30    Estimated Creatinine Clearance: 65.6 ml/min (by C-G formula based on Cr of 0.74).   Assessment: 62 y/o female patient admitted with chest pain requiring anticoagulation for r/o MI. EKG wnl and enzymes negative x 3.  PMH: HLD, h/o CP, hypothyroid, allergies,  Anticoag: Heparin level 0.33 in goal range. CBC stable overnight  Cards: h/o CP and HLD. 110/66, HR 61. Meds: ASA 325mg , NTG  ointment  Endo: Synthroid  GI/Endo: Protonix.   Goal of Therapy:  Heparin level 0.3-0.7 units/ml Monitor platelets by anticoagulation protocol: Yes   Plan:  Continue heparin 850 units/hr. Will re-confirm heparin level this afternoon vs f/u after cath  Pasty Spillers 07/02/2012,9:42 AM

## 2012-07-02 NOTE — Progress Notes (Signed)
ANTICOAGULATION CONSULT NOTE - Follow Up Consult  Pharmacy Consult for Heparin Indication: chest pain/ACS  Allergies  Allergen Reactions  . Bee Venom Anaphylaxis and Other (See Comments)    Causes paralyzation; can hear and see but cannot move or talk. Has EPI-Pen.   . Codeine Anaphylaxis and Other (See Comments)    REACTION-SHUTS DOWN ENTIRE BODY, CARDIAC ARREST.   Marland Kitchen Morphine And Related Shortness Of Breath    ENDED UP IN HOSPITAL.   . Other Other (See Comments)    ALL OPIOIDS; SHUTS DOWN ENTIRE BODY, CARDIAC ARREST.  Has pelvic mesh implanted, cannot remove, causing various problems with bladder.    . Lactose Intolerance (Gi) Other (See Comments)    GI discomfort-Only to Milk, other dairy products are fine.   . Tramadol Other (See Comments)    Unknown-Cross reaction with codeine  . Sulfonamide Derivatives Hives, Itching, Rash and Other (See Comments)    REACTION: Increased body temp, blisters all over.      Patient Measurements: Height: 5' (152.4 cm) Weight: 160 lb 1.6 oz (72.621 kg) IBW/kg (Calculated) : 45.5 Heparin Dosing Weight:   Vital Signs: Temp: 98.3 F (36.8 C) (06/12 0525) Temp src: Oral (06/12 0525) BP: 110/66 mmHg (06/12 0525) Pulse Rate: 61 (06/12 0525)  Labs:  Recent Labs  07/01/12 1348 07/01/12 1920 07/02/12 0020 07/02/12 0740 07/02/12 1307  HGB 13.2  --   --  13.1  --   HCT 38.6  --   --  37.5  --   PLT 246  --   --  241  --   LABPROT  --   --   --  13.0  --   INR  --   --   --  0.99  --   HEPARINUNFRC  --   --   --  0.33 0.25*  CREATININE 0.67  --   --  0.74  --   TROPONINI  --  <0.30 <0.30 <0.30  --     Estimated Creatinine Clearance: 65.6 ml/min (by C-G formula based on Cr of 0.74).   Assessment: 62 y/o female patient admitted with chest pain requiring anticoagulation for r/o MI. EKG wnl and enzymes negative x 3.  Anticoag: Heparin level 0.33 in goal range now down to 0.25. CBC stable overnight  Goal of Therapy:  Heparin level  0.3-0.7 units/ml Monitor platelets by anticoagulation protocol: Yes   Plan:  Increase heparin to 1000 uts/hr. Will re-check heparin level this afternoon vs f/u after cath  Pasty Spillers 07/02/2012,1:54 PM

## 2012-07-02 NOTE — Discharge Summary (Signed)
Physician Discharge Summary  [name]:[id number] DOB: 1950/10/17 DOA: 07/01/2012  PCP: [name]., MD  Admit date: 07/01/2012 Discharge date: 07/02/2012  Time spent: 30 minutes  Recommendations for Outpatient Follow-up:  1. Please followup with primary care physician for further evaluation chest pain 2. No further cardiac workup currently indicated 3. Please followup with [name] for further EP need   Discharge Diagnoses:  Active Problems:   HYPOTHYROIDISM   HYPERLIPIDEMIA NEC/NOS   CHEST PAIN UNSPECIFIED   Intermediate coronary syndrome   Discharge Condition: Well  Diet recommendation: Heart healthy  Filed Weights   07/01/12 1336 07/01/12 1811 07/02/12 0525  Weight: 68.04 kg (150 lb) 66.225 kg (146 lb) 72.621 kg (160 lb 1.6 oz)    History of present illness:  This pleasant 62 year female history of hypothyroid GERD atypical chest pain with prior negative workup presented to the emergency room 07/01/2012 with substernal crushing chest pain 10/10 intensity relieved by aspirin nitroglycerin since insurance breath nausea lightheadedness. Cardiac enzymes negative this. She states that pain felt like soreness take some pressure in the chest.  Patient because of her intermediate probability and son having history of Wolff-Parkinson-White syndrome as well as coffee pain was felt to be intermediate probability and went for cardiac catheterization is negative as per below. Patient initially was on unfractionated heparin which was soft but they stopped She'll be discharge back to Dr. Mathews [name] office for consideration of potential GERD like etiology versus most likely muscular skeletal spasm  Procedures: Procedural Findings-Cardiac cath 6/12 Hemodynamics:  AO 116/67 mean 87 mm Hg  LV 111/11 mm Hg  Coronary angiography:  Coronary dominance: right  Left mainstem: Short, normal.  Left anterior descending (LAD): Minor wall irregularities less than 10%  Left circumflex  (LCx): Normal  Right coronary artery (RCA): 20% focal disease proximally, otherwise normal.  Left ventriculography: Left ventricular systolic function is normal, LVEF is estimated at 55-65%, there is no significant mitral regurgitation  Final Conclusions:  1. No significant CAD  2. Normal LV function.   Consultations:  Cardiology  Discharge Exam: Filed Vitals:   07/02/12 1405 07/02/12 1450 07/02/12 1545 07/02/12 1600  BP: 111/58  115/69 120/71  Pulse: 62 63 62 58  Temp: 98.3 F (36.8 C)     TempSrc:      Resp: 18     Height:      Weight:      SpO2: 96%  93% 96%   Alert pleasant just returned from cardiac cath no apparent issues currently General: EOMI Cardiovascular: S1-S2 no murmur rub or gallop Respiratory: Clinically clear  Discharge Instructions  Discharge Orders   Future Orders Complete By Expires     Diet - low sodium heart healthy  As directed     Increase activity slowly  As directed         Medication List    TAKE these medications       CALTRATE 600+D PLUS PO  Take 600 mg by mouth 3 (three) times daily.     co-enzyme Q-10 30 MG capsule  Take 30 mg by mouth 2 (two) times daily.     Cranberry 400 MG Caps  Take 400 mg by mouth daily.     denosumab 60 MG/ML Soln injection  Commonly known as:  PROLIA  Inject 60 mg into the skin every 6 (six) months. Administer in upper arm, thigh, or abdomen     EPIPEN 2-PAK 0.3 mg/0.3 mL Devi  Generic drug:  EPINEPHrine  Inject  0.3 mg into the muscle once as needed (for bee venom).     estradiol 1 MG tablet  Commonly known as:  ESTRACE  Take 1 mg by mouth daily.     FISH OIL TRIPLE STRENGTH PO  Take 1 capsule by mouth 2 (two) times daily with a meal.     levothyroxine 150 MCG tablet  Commonly known as:  SYNTHROID, LEVOTHROID  Take 150 mcg by mouth at bedtime.     magnesium oxide 400 MG tablet  Commonly known as:  MAG-OX  Take 200 mg by mouth daily.     MELATONIN PO  Take 1 tablet by mouth at bedtime as  needed. For sleep     omeprazole 20 MG capsule  Commonly known as:  PRILOSEC  Take 20 mg by mouth daily.     OVER THE COUNTER MEDICATION  Apply 1 application topically daily as needed (for shoulder pain).     prenatal vitamin w/FE, FA 27-1 MG Tabs  Take 1 tablet by mouth daily.     SELENIUM PO  Take 1 tablet by mouth 2 (two) times daily with a meal.       Allergies  Allergen Reactions  . Bee Venom Anaphylaxis and Other (See Comments)    Causes paralyzation; can hear and see but cannot move or talk. Has EPI-Pen.   . Codeine Anaphylaxis and Other (See Comments)    REACTION-SHUTS DOWN ENTIRE BODY, CARDIAC ARREST.   Marland Kitchen Morphine And Related Shortness Of Breath    ENDED UP IN HOSPITAL.   . Other Other (See Comments)    ALL OPIOIDS; SHUTS DOWN ENTIRE BODY, CARDIAC ARREST.  Has pelvic mesh implanted, cannot remove, causing various problems with bladder.    . Lactose Intolerance (Gi) Other (See Comments)    GI discomfort-Only to Milk, other dairy products are fine.   . Tramadol Other (See Comments)    Unknown-Cross reaction with codeine  . Sulfonamide Derivatives Hives, Itching, Rash and Other (See Comments)    REACTION: Increased body temp, blisters all over.        The results of significant diagnostics from this hospitalization (including imaging, microbiology, ancillary and laboratory) are listed below for reference.    Significant Diagnostic Studies: Ct Angio Chest Pe W/cm &/or Wo Cm  07/01/2012   *RADIOLOGY REPORT*  Clinical Data: Chest pain and shortness of breath.  CT ANGIOGRAPHY CHEST  Technique:  Multidetector CT imaging of the chest using the standard protocol during bolus administration of intravenous contrast. Multiplanar reconstructed images including MIPs were obtained and reviewed to evaluate the vascular anatomy.  Contrast: 80mL OMNIPAQUE IOHEXOL 350 MG/ML SOLN  Comparison: None.  Findings: The pulmonary arteries are well opacified.  There is no evidence of pulmonary  embolism.  The thoracic aorta is of normal caliber.  Heart size is normal.  Lung windows show some respiratory motion artifact.  There may be a component of pulmonary venous hypertension and interstitial edema as suggested by chest x-ray.  No overt airspace edema or pleural fluid is identified.  No pulmonary nodules or enlarged lymph nodes are seen.  The bony thorax is unremarkable.  IMPRESSION: No evidence of pulmonary embolism.  There is suggestion of pulmonary venous hypertension/interstitial edema.   Original Report Authenticated By: Irish Lack, M.D.   Dg Chest Port 1 View  07/01/2012   *RADIOLOGY REPORT*  Clinical Data: Chest pain and shortness of breath.  PORTABLE CHEST - 1 VIEW  Comparison: 05/14/2012  Findings: The heart size is mildly enlarged.  No pleural effusions identified.  Pulmonary vascular congestion is noted.  The lung volumes are low and there is atelectasis in both lung bases.  IMPRESSION:  1.  Low lung volumes and bibasilar atelectasis. 2.  Pulmonary vascular congestion noted.   Original Report Authenticated By: Signa Kell, M.D.    Microbiology: No results found for this or any previous visit (from the past 240 hour(s)).   Labs: Basic Metabolic Panel:  Recent Labs Lab 07/01/12 1348 07/02/12 0740  NA 139 140  K 4.1 3.8  CL 105 107  CO2 27 23  GLUCOSE 104* 106*  BUN 14 13  CREATININE 0.67 0.74  CALCIUM 9.1 9.1   Liver Function Tests: No results found for this basename: AST, ALT, ALKPHOS, BILITOT, PROT, ALBUMIN,  in the last 168 hours No results found for this basename: LIPASE, AMYLASE,  in the last 168 hours No results found for this basename: AMMONIA,  in the last 168 hours CBC:  Recent Labs Lab 07/01/12 1348 07/02/12 0740  WBC 4.9 5.8  HGB 13.2 13.1  HCT 38.6 37.5  MCV 85.4 84.5  PLT 246 241   Cardiac Enzymes:  Recent Labs Lab 07/01/12 1920 07/02/12 0020 07/02/12 0740  TROPONINI <0.30 <0.30 <0.30   BNP: BNP (last 3 results)  Recent  Labs  07/01/12 1349  PROBNP 71.1   CBG: No results found for this basename: GLUCAP,  in the last 168 hours     Signed:  Rhetta Mura  [facility] 07/02/2012, 4:33 PM

## 2012-07-02 NOTE — Progress Notes (Signed)
Utilization review completed.  

## 2012-07-02 NOTE — Progress Notes (Signed)
TR band removed without complications, positive reverse alan's test. Cjohnson RN

## 2012-07-02 NOTE — H&P (View-Only) (Signed)
 Patient ID: [name] MRN: [id number], DOB/AGE: 61/15/1952   Admit date: 07/01/2012  Primary Physician: SHELTON,KIMBERLY R., MD Primary Cardiologist: S. Klein, MD   Pt. Profile:  62 y/o female with h/o atypical chest pain who presented to the ED earlier today with chest pain.  Problem List  Past Medical History  Diagnosis Date  . Hyperlipidemia   . Hypothyroidism   . History of chest pain     with normal Myoview in 2007  . Allergy   . GERD (gastroesophageal reflux disease)     Past Surgical History  Procedure Laterality Date  . Cholecystectomy    . Salpingoophorectomy  2005    Bilateral fallopian tubes and ovaries  . Abdominal hysterectomy  2005    Leiomyoma  . Thyroidectomy      Partial thyroidectomy at age 18-19; per patient, not cancer  . Colon surgery    . Fracture surgery    . Cesarean section       Allergies  Allergies  Allergen Reactions  . Bee Venom Anaphylaxis and Other (See Comments)    Causes paralyzation; can hear and see but cannot move or talk. Has EPI-Pen.   . Codeine Anaphylaxis and Other (See Comments)    REACTION-SHUTS DOWN ENTIRE BODY, CARDIAC ARREST.   . Morphine And Related Shortness Of Breath    ENDED UP IN HOSPITAL.   . Other Other (See Comments)    ALL OPIOIDS; SHUTS DOWN ENTIRE BODY, CARDIAC ARREST.  Has pelvic mesh implanted, cannot remove, causing various problems with bladder.    . Lactose Intolerance (Gi) Other (See Comments)    GI discomfort-Only to Milk, other dairy products are fine.   . Tramadol Other (See Comments)    Unknown-Cross reaction with codeine  . Sulfonamide Derivatives Hives, Itching, Rash and Other (See Comments)    REACTION: Increased body temp, blisters all over.      HPI  62 y/o female with prior h/o chest pain s/p negative stress testing in 2007.  Her son had a h/o WPW with subsequent VF arrest and death.  She has subsequently been followed by Dr. Klein annually and generally has done well without  recurrent chest pain.  Today however, she was walking in a hallway at work and developed sudden onset of severe, 9/10 substernal chest squeezing and tightness radiating through to her back, associated with dyspnea and nausea.  Her supervisor called EMS and she was taken to the [facility]. En route, she was treated with sl NTG with reduction of chest pain from 9/10 to 6/10.  Over the past 5 hrs, pain has eased off further and is currently described as a 2/10 soreness.  She is otherwise stable.  ECG is nonacute and initial troponin is negative.  We've been asked to eval.  Home Medications  Prior to Admission medications   Medication Sig Start Date End Date Taking? Authorizing Provider  Calcium Carbonate-Vit D-Min (CALTRATE 600+D PLUS PO) Take 600 mg by mouth 3 (three) times daily.   Yes Historical Provider, MD  co-enzyme Q-10 30 MG capsule Take 30 mg by mouth 2 (two) times daily.   Yes Historical Provider, MD  Cranberry 400 MG CAPS Take 400 mg by mouth daily.    Yes Historical Provider, MD  denosumab (PROLIA) 60 MG/ML SOLN injection Inject 60 mg into the skin every 6 (six) months. Administer in upper arm, thigh, or abdomen   Yes Historical Provider, MD  EPINEPHrine (EPIPEN 2-PAK) 0.3 mg/0.3 mL DEVI Inject 0.3 mg   into the muscle once as needed (for bee venom).   Yes Historical Provider, MD  estradiol (ESTRACE) 1 MG tablet Take 1 mg by mouth daily.   Yes Historical Provider, MD  levothyroxine (SYNTHROID, LEVOTHROID) 150 MCG tablet Take 150 mcg by mouth at bedtime.    Yes Historical Provider, MD  magnesium oxide (MAG-OX) 400 MG tablet Take 200 mg by mouth daily.   Yes Historical Provider, MD  MELATONIN PO Take 1 tablet by mouth at bedtime as needed. For sleep   Yes Historical Provider, MD  Omega-3 Fatty Acids (FISH OIL TRIPLE STRENGTH PO) Take 1 capsule by mouth 2 (two) times daily with a meal.   Yes Historical Provider, MD  omeprazole (PRILOSEC) 20 MG capsule Take 20 mg by mouth daily.   Yes Historical  Provider, MD  OVER THE COUNTER MEDICATION Apply 1 application topically daily as needed (for shoulder pain).   Yes Historical Provider, MD  prenatal vitamin w/FE, FA (PRENATAL 1 + 1) 27-1 MG TABS Take 1 tablet by mouth daily.   Yes Historical Provider, MD  SELENIUM PO Take 1 tablet by mouth 2 (two) times daily with a meal.   Yes Historical Provider, MD    Family History  Family History  Problem Relation Age of Onset  . Cancer Mother 76    Liver cancer - did not drink alcohol  . Cancer Sister 59    breast cancer  . Cancer Brother 55    Stomach cancer  . Cancer Maternal Aunt     breast cancer in her 50s  . Cancer Maternal Uncle     liver cancer diagnosed and died in 70s  . Cancer Sister 45    Kidney cancer  . Cancer Maternal Uncle     lung cancer in a non smoker  . Cancer Maternal Uncle     colon cancer in his 60s  . Cancer Maternal Aunt     "female cancer"  . Cancer Maternal Aunt     breast cancer in her 60s  . Heart disease Son     wolf parkinson white  . Seizures Daughter     Social History  History   Social History  . Marital Status: Widowed    Spouse Name: N/A    Number of Children: N/A  . Years of Education: N/A   Occupational History  . Not on file.   Social History Main Topics  . Smoking status: Never Smoker   . Smokeless tobacco: Not on file  . Alcohol Use: No  . Drug Use: No  . Sexually Active: No     Comment: Widow for 20 years   Other Topics Concern  . Not on file   Social History Narrative   Widowed.       Review of Systems General:  No chills, fever, night sweats or weight changes.  Cardiovascular:  +++ chest pain with dyspnea and nausea as outlined above.  No edema, orthopnea, palpitations, paroxysmal nocturnal dyspnea. Dermatological: No rash, lesions/masses Respiratory: No cough, dyspnea Urologic: No hematuria, dysuria Abdominal:   No nausea, vomiting, diarrhea, bright red blood per rectum, melena, or hematemesis Neurologic:  No  visual changes, wkns, changes in mental status. All other systems reviewed and are otherwise negative except as noted above.  Physical Exam  Blood pressure 113/73, pulse 61, temperature 98 F (36.7 C), temperature source Oral, resp. rate 15, height 5' 0.5" (1.537 m), weight 150 lb (68.04 kg), SpO2 96.00%.  General: Pleasant, NAD Psych: Normal affect.   Neuro: Alert and oriented X 3. Moves all extremities spontaneously. HEENT: Normal  Neck: Supple without bruits or JVD. Lungs:  Resp regular and unlabored, CTA. Heart: RRR no s3, s4, or murmurs. Abdomen: Soft, non-tender, non-distended, BS + x 4.  Extremities: No clubbing, cyanosis or edema. DP/PT/Radials 2+ and equal bilaterally.  Labs  Trop i, poc: 0.00  pBNP 71.1  Lab Results  Component Value Date   WBC 4.9 07/01/2012   HGB 13.2 07/01/2012   HCT 38.6 07/01/2012   MCV 85.4 07/01/2012   PLT 246 07/01/2012    Recent Labs Lab 07/01/12 1348  NA 139  K 4.1  CL 105  CO2 27  BUN 14  CREATININE 0.67  CALCIUM 9.1  GLUCOSE 104*    Radiology/Studies  Dg Chest Port 1 View  07/01/2012   *RADIOLOGY REPORT*  Clinical Data: Chest pain and shortness of breath.  PORTABLE CHEST - 1 VIEW  Comparison: 05/14/2012  Findings: The heart size is mildly enlarged.  No pleural effusions identified.  Pulmonary vascular congestion is noted.  The lung volumes are low and there is atelectasis in both lung bases.  IMPRESSION:  1.  Low lung volumes and bibasilar atelectasis. 2.  Pulmonary vascular congestion noted.   Original Report Authenticated By: Taylor Stroud, M.D.   ECG  Rsr, 75, early repol, no acute st/t changes.  ASSESSMENT AND PLAN  1.  [place]:  Pt presented following an episode of severe chest pain that was nitrate responsive.  She is currently comfortable though continues to report mild chest soreness.  ECG is nonacute and initial troponin was normal.  Agree with admission, cycling CE.  She is scheduled for a CTA of the chest.  Provided that  this is negative, we will plan on diagnostic cath in the AM to r/o obstructive CAD.  As she continues to report c/p, we will add heparin and nitro.  Add asa.  Lipids pending (LDL 99 in 02/2008).  Signed, Christopher Berge, NP 07/01/2012, 5:18 PM  Patient seen and examined independently. Chris Berge's, NP note reviewed carefully - agree with his assessment and plan. I have edited the note based on my findings. CP is very concerning for unstable angina. Fortunately  ECG and CE are negative so far. CT chest also negative for PE. Discussed risk/indications of cath and she wants to proceed. I have discussed with Dr. Klein who knows her well and agrees.   Atticus Wedin,MD 11:33 PM    

## 2012-07-02 NOTE — Discharge Instructions (Signed)
Chest Pain (Nonspecific) °Chest pain has many causes. Your pain could be caused by something serious, such as a heart attack or a blood clot in the lungs. It could also be caused by something less serious, such as a chest bruise or a virus. Follow up with your doctor. More lab tests or other studies may be needed to find the cause of your pain. Most of the time, nonspecific chest pain will improve within 2 to 3 days of rest and mild pain medicine. °HOME CARE °· For chest bruises, you may put ice on the sore area for 15-20 minutes, 3-4 times a day. Do this only if it makes you or your child feel better. °· Put ice in a plastic bag. °· Place a towel between the skin and the bag. °· Rest for the next 2 to 3 days. °· Go back to work if the pain improves. °· See your doctor if the pain lasts longer than 1 to 2 weeks. °· Only take medicine as told by your doctor. °· Quit smoking if you smoke. °GET HELP RIGHT AWAY IF:  °· There is more pain or pain that spreads to the arm, neck, jaw, back, or belly (abdomen). °· You or your child has shortness of breath. °· You or your child coughs more than usual or coughs up blood. °· You or your child has very bad back or belly pain, feels sick to his or her stomach (nauseous), or throws up (vomits). °· You or your child has very bad weakness. °· You or your child passes out (faints). °· You or your child has a temperature by mouth above 102° F (38.9° C), not controlled by medicine. °Any of these problems may be serious and may be an emergency. Do not wait to see if the problems will go away. Get medical help right away. Call your local emergency services 911 in [place] Do not drive yourself to the hospital. °MAKE SURE YOU:  °· Understand these instructions. °· Will watch this condition. °· Will get help right away if you or your child is not doing well or gets worse. °Document Released: 06/26/2007 Document Revised: 04/01/2011 Document Reviewed: 06/26/2007 °[employer]® Patient Information  ©2014 [employer]. ° °
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
## 2012-07-02 NOTE — CV Procedure (Signed)
   Cardiac Catheterization Procedure Note  Name: [name] MRN: [id number] DOB: 1950-04-10  Procedure: Left Heart Cath, Selective Coronary Angiography, LV angiography  Indication: 62 yo female presents with severe precordial chest pain. Ecg and troponins are normal. Remote myoview in 2007 was normal.   Procedural Details: The right wrist was prepped, draped, and anesthetized with 1% lidocaine. Using the modified Seldinger technique, a 5 French sheath was introduced into the right radial artery. 3 mg of verapamil was administered through the sheath, weight-based unfractionated heparin was administered intravenously. Standard Judkins catheters were used for selective coronary angiography and left ventriculography. Catheter exchanges were performed over an exchange length guidewire. There were no immediate procedural complications. A TR band was used for radial hemostasis at the completion of the procedure.  The patient was transferred to the post catheterization recovery area for further monitoring.  Procedural Findings: Hemodynamics: AO 116/67 mean 87 mm Hg LV 111/11 mm Hg  Coronary angiography: Coronary dominance: right  Left mainstem: Short, normal.  Left anterior descending (LAD):  Minor wall irregularities less than 10%  Left circumflex (LCx): Normal  Right coronary artery (RCA): 20% focal disease proximally, otherwise normal.  Left ventriculography: Left ventricular systolic function is normal, LVEF is estimated at 55-65%, there is no significant mitral regurgitation   Final Conclusions:   1. No significant CAD 2. Normal LV function.  Recommendations: evaluate for noncardiac chest pain. Stable for DC this pm.  Theron Arista [facility] 07/02/2012, 3:23 PM

## 2012-07-02 NOTE — Interval H&P Note (Signed)
History and Physical Interval Note:  07/02/2012 2:54 PM  [name]  has presented today for surgery, with the diagnosis of cp  The various methods of treatment have been discussed with the patient and family. After consideration of risks, benefits and other options for treatment, the patient has consented to  Procedure(s): LEFT HEART CATHETERIZATION WITH CORONARY ANGIOGRAM (N/A) as a surgical intervention .  The patient's history has been reviewed, patient examined, no change in status, stable for surgery.  I have reviewed the patient's chart and labs.  Questions were answered to the patient's satisfaction.   Cath Lab Visit (complete for each Cath Lab visit)  Clinical Evaluation Leading to the Procedure:   ACS: yes  Non-ACS:    Anginal Classification: CCS IV  Anti-ischemic medical therapy: No Therapy  Non-Invasive Test Results: No non-invasive testing performed  Prior CABG: No previous CABG        Haadi Santellan [place]

## 2012-07-09 ENCOUNTER — Telehealth: Payer: Self-pay | Admitting: Internal Medicine

## 2012-07-09 NOTE — Telephone Encounter (Signed)
New Problem:    Patient called in because after the Cath [name] told her that he may want to have her do some stress testing and did not specify which test.  Please call back.

## 2012-07-09 NOTE — Telephone Encounter (Signed)
Discussed with dr Graciela Husbands, the pt does not need any further testing at this time. She does not need to restart baby asa

## 2012-07-15 ENCOUNTER — Telehealth: Payer: Self-pay | Admitting: Internal Medicine

## 2012-07-15 NOTE — Telephone Encounter (Signed)
Message sent to dr Graciela Husbands, waiting for his reply.

## 2012-07-15 NOTE — Telephone Encounter (Signed)
New Prob     Pt requesting a referral to a general practitioner. Please call.

## 2012-07-16 NOTE — Telephone Encounter (Signed)
Spoke with pt, questions regarding [facility] primary care offices and locations answered.

## 2012-09-15 ENCOUNTER — Telehealth: Payer: Self-pay | Admitting: *Deleted

## 2012-09-15 NOTE — Telephone Encounter (Signed)
MD received disc with pt medical records. Not able to look at need password. Called pt to get password for Dr. Felicity Coyer. Had to leave vm to RTC...Raechel Chute

## 2012-09-17 NOTE — Telephone Encounter (Signed)
Noted, thanks!

## 2012-09-17 NOTE — Telephone Encounter (Signed)
Tried calling pt again to get password for medical records disc # on file is not the correct phone number for pt will save disc until appt...lmb

## 2012-11-26 ENCOUNTER — Other Ambulatory Visit: Payer: Self-pay

## 2012-11-30 ENCOUNTER — Ambulatory Visit: Payer: 59 | Admitting: Internal Medicine

## 2012-11-30 DIAGNOSIS — Z0289 Encounter for other administrative examinations: Secondary | ICD-10-CM

## 2013-01-26 ENCOUNTER — Telehealth: Payer: Self-pay | Admitting: Internal Medicine

## 2013-01-26 NOTE — Telephone Encounter (Signed)
New message    Referral to PCP .   Previous referral is not until April or may.   Patient is asking for something sooner.

## 2013-01-27 ENCOUNTER — Telehealth: Payer: Self-pay | Admitting: Internal Medicine

## 2013-01-27 NOTE — Telephone Encounter (Signed)
[name] at 01/27/2013 3:57 PM    Status: Signed       New message  Pt returning nurses' phone call          Will discuss with Dr. Caryl Comes and call pt back. Pt is agreeable to plan.

## 2013-01-27 NOTE — Telephone Encounter (Signed)
New message ° ° ° ° ° °Pt returning nurses' phone call °

## 2013-01-27 NOTE — Telephone Encounter (Signed)
A user error has taken place: encounter opened in error, closed for administrative reasons.  See 1/6 telephone note

## 2013-01-29 NOTE — Telephone Encounter (Signed)
Advised pt that 3/4 months for new pt PCP is a normal waiting period - per Dr. Caryl Comes. Also recommended [facility] as an alternative. Pt asked about f/u with Caryl Comes, after reviewing chart no follow up seen - per Dr. Caryl Comes year follow up. Pt advised to call office (we are at satellite office today) and schedule follow up. Pt verbalized understanding and agreeable to plan.

## 2013-02-10 DIAGNOSIS — N3946 Mixed incontinence: Secondary | ICD-10-CM | POA: Insufficient documentation

## 2013-02-11 ENCOUNTER — Encounter: Payer: Self-pay | Admitting: Physician Assistant

## 2013-02-11 ENCOUNTER — Ambulatory Visit (INDEPENDENT_AMBULATORY_CARE_PROVIDER_SITE_OTHER): Payer: 59 | Admitting: Physician Assistant

## 2013-02-11 VITALS — BP 122/82 | HR 78 | Temp 99.0°F | Resp 18 | Ht 60.0 in | Wt 157.8 lb

## 2013-02-11 DIAGNOSIS — Z780 Asymptomatic menopausal state: Secondary | ICD-10-CM

## 2013-02-11 DIAGNOSIS — Z23 Encounter for immunization: Secondary | ICD-10-CM

## 2013-02-11 DIAGNOSIS — D126 Benign neoplasm of colon, unspecified: Secondary | ICD-10-CM

## 2013-02-11 DIAGNOSIS — K635 Polyp of colon: Secondary | ICD-10-CM

## 2013-02-11 MED ORDER — COENZYME Q10 30 MG PO CAPS: 30.0000 mg | ORAL_CAPSULE | Freq: Two times a day (BID) | ORAL | Status: DC

## 2013-02-11 MED ORDER — ESTRADIOL 1 MG PO TABS: 1.0000 mg | ORAL_TABLET | Freq: Every day | ORAL | Status: DC

## 2013-02-11 NOTE — Patient Instructions (Signed)
It was great to meet you today [name]!  I have ordered your refill prescriptions as discussed. Please continue to take as directed.  I have ordered referral for colonoscopy and GYN.   Continue taking all other medications as prescribed.  Colon Polyps Polyps are lumps of extra tissue growing inside the body. Polyps can grow in the large intestine (colon). Most colon polyps are noncancerous (benign). However, some colon polyps can become cancerous over time. Polyps that are larger than a pea may be harmful. To be safe, caregivers remove and test all polyps. CAUSES  Polyps form when mutations in the genes cause your cells to grow and divide even though no more tissue is needed. RISK FACTORS There are a number of risk factors that can increase your chances of getting colon polyps. They include:  Being older than 50 years.  Family history of colon polyps or colon cancer.  Long-term colon diseases, such as colitis or Crohn disease.  Being overweight.  Smoking.  Being inactive.  Drinking too much alcohol. SYMPTOMS  Most small polyps do not cause symptoms. If symptoms are present, they may include:  Blood in the stool. The stool may look dark red or black.  Constipation or diarrhea that lasts longer than 1 week. DIAGNOSIS People often do not know they have polyps until their caregiver finds them during a regular checkup. Your caregiver can use 4 tests to check for polyps:  Digital rectal exam. The caregiver wears gloves and feels inside the rectum. This test would find polyps only in the rectum.  Barium enema. The caregiver puts a liquid called barium into your rectum before taking X-rays of your colon. Barium makes your colon look white. Polyps are dark, so they are easy to see in the X-ray pictures.  Sigmoidoscopy. A thin, flexible tube (sigmoidoscope) is placed into your rectum. The sigmoidoscope has a light and tiny camera in it. The caregiver uses the sigmoidoscope to look at  the last third of your colon.  Colonoscopy. This test is like sigmoidoscopy, but the caregiver looks at the entire colon. This is the most common method for finding and removing polyps. TREATMENT  Any polyps will be removed during a sigmoidoscopy or colonoscopy. The polyps are then tested for cancer. PREVENTION  To help lower your risk of getting more colon polyps:  Eat plenty of fruits and vegetables. Avoid eating fatty foods.  Do not smoke.  Avoid drinking alcohol.  Exercise every day.  Lose weight if recommended by your caregiver.  Eat plenty of calcium and folate. Foods that are rich in calcium include milk, cheese, and broccoli. Foods that are rich in folate include chickpeas, kidney beans, and spinach. HOME CARE INSTRUCTIONS Keep all follow-up appointments as directed by your caregiver. You may need periodic exams to check for polyps. SEEK MEDICAL CARE IF: You notice bleeding during a bowel movement. Document Released: 10/04/2003 Document Revised: 04/01/2011 Document Reviewed: 03/19/2011 [facility] Patient Information 2014 [place].     Health Maintenance, Female A healthy lifestyle and preventative care can promote health and wellness.  Maintain regular health, dental, and eye exams.  Eat a healthy diet. Foods like vegetables, fruits, whole grains, low-fat dairy products, and lean protein foods contain the nutrients you need without too many calories. Decrease your intake of foods high in solid fats, added sugars, and salt. Get information about a proper diet from your caregiver, if necessary.  Regular physical exercise is one of the most important things you can do for your  health. Most adults should get at least 150 minutes of moderate-intensity exercise (any activity that increases your heart rate and causes you to sweat) each week. In addition, most adults need muscle-strengthening exercises on 2 or more days a week.   Maintain a healthy weight. The body mass  index (BMI) is a screening tool to identify possible weight problems. It provides an estimate of body fat based on height and weight. Your caregiver can help determine your BMI, and can help you achieve or maintain a healthy weight. For adults 20 years and older:  A BMI below 18.5 is considered underweight.  A BMI of 18.5 to 24.9 is normal.  A BMI of 25 to 29.9 is considered overweight.  A BMI of 30 and above is considered obese.  Maintain normal blood lipids and cholesterol by exercising and minimizing your intake of saturated fat. Eat a balanced diet with plenty of fruits and vegetables. Blood tests for lipids and cholesterol should begin at age 55 and be repeated every 5 years. If your lipid or cholesterol levels are high, you are over 50, or you are a high risk for heart disease, you may need your cholesterol levels checked more frequently.Ongoing high lipid and cholesterol levels should be treated with medicines if diet and exercise are not effective.  If you smoke, find out from your caregiver how to quit. If you do not use tobacco, do not start.  Lung cancer screening is recommended for adults aged 41 80 years who are at high risk for developing lung cancer because of a history of smoking. Yearly low-dose computed tomography (CT) is recommended for people who have at least a 30-pack-year history of smoking and are a current smoker or have quit within the past 15 years. A pack year of smoking is smoking an average of 1 pack of cigarettes a day for 1 year (for example: 1 pack a day for 30 years or 2 packs a day for 15 years). Yearly screening should continue until the smoker has stopped smoking for at least 15 years. Yearly screening should also be stopped for people who develop a health problem that would prevent them from having lung cancer treatment.  If you are pregnant, do not drink alcohol. If you are breastfeeding, be very cautious about drinking alcohol. If you are not pregnant and  choose to drink alcohol, do not exceed 1 drink per day. One drink is considered to be 12 ounces (355 mL) of beer, 5 ounces (148 mL) of wine, or 1.5 ounces (44 mL) of liquor.  Avoid use of street drugs. Do not share needles with anyone. Ask for help if you need support or instructions about stopping the use of drugs.  High blood pressure causes heart disease and increases the risk of stroke. Blood pressure should be checked at least every 1 to 2 years. Ongoing high blood pressure should be treated with medicines, if weight loss and exercise are not effective.  If you are 7 to 62 years old, ask your caregiver if you should take aspirin to prevent strokes.  Diabetes screening involves taking a blood sample to check your fasting blood sugar level. This should be done once every 3 years, after age 63, if you are within normal weight and without risk factors for diabetes. Testing should be considered at a younger age or be carried out more frequently if you are overweight and have at least 1 risk factor for diabetes.  Breast cancer screening is essential preventative care for  women. You should practice "breast self-awareness." This means understanding the normal appearance and feel of your breasts and may include breast self-examination. Any changes detected, no matter how small, should be reported to a caregiver. Women in their 110s and 30s should have a clinical breast exam (CBE) by a caregiver as part of a regular health exam every 1 to 3 years. After age 76, women should have a CBE every year. Starting at age 88, women should consider having a mammogram (breast X-ray) every year. Women who have a family history of breast cancer should talk to their caregiver about genetic screening. Women at a high risk of breast cancer should talk to their caregiver about having an MRI and a mammogram every year.  Breast cancer gene (BRCA)-related cancer risk assessment is recommended for women who have family members  with BRCA-related cancers. BRCA-related cancers include breast, ovarian, tubal, and peritoneal cancers. Having family members with these cancers may be associated with an increased risk for harmful changes (mutations) in the breast cancer genes BRCA1 and BRCA2. Results of the assessment will determine the need for genetic counseling and BRCA1 and BRCA2 testing.  The Pap test is a screening test for cervical cancer. Women should have a Pap test starting at age 44. Between ages 87 and 51, Pap tests should be repeated every 2 years. Beginning at age 39, you should have a Pap test every 3 years as long as the past 3 Pap tests have been normal. If you had a hysterectomy for a problem that was not cancer or a condition that could lead to cancer, then you no longer need Pap tests. If you are between ages 67 and 38, and you have had normal Pap tests going back 10 years, you no longer need Pap tests. If you have had past treatment for cervical cancer or a condition that could lead to cancer, you need Pap tests and screening for cancer for at least 20 years after your treatment. If Pap tests have been discontinued, risk factors (such as a new sexual partner) need to be reassessed to determine if screening should be resumed. Some women have medical problems that increase the chance of getting cervical cancer. In these cases, your caregiver may recommend more frequent screening and Pap tests.  The human papillomavirus (HPV) test is an additional test that may be used for cervical cancer screening. The HPV test looks for the virus that can cause the cell changes on the cervix. The cells collected during the Pap test can be tested for HPV. The HPV test could be used to screen women aged 78 years and older, and should be used in women of any age who have unclear Pap test results. After the age of 30, women should have HPV testing at the same frequency as a Pap test.  Colorectal cancer can be detected and often prevented.  Most routine colorectal cancer screening begins at the age of 55 and continues through age 87. However, your caregiver may recommend screening at an earlier age if you have risk factors for colon cancer. On a yearly basis, your caregiver may provide home test kits to check for hidden blood in the stool. Use of a small camera at the end of a tube, to directly examine the colon (sigmoidoscopy or colonoscopy), can detect the earliest forms of colorectal cancer. Talk to your caregiver about this at age 17, when routine screening begins. Direct examination of the colon should be repeated every 5 to 10 years through  age 38, unless early forms of pre-cancerous polyps or small growths are found.  Hepatitis C blood testing is recommended for all people born from [birth year] through 1965 and any individual with known risks for hepatitis C.  Practice safe sex. Use condoms and avoid high-risk sexual practices to reduce the spread of sexually transmitted infections (STIs). Sexually active women aged 46 and younger should be checked for Chlamydia, which is a common sexually transmitted infection. Older women with new or multiple partners should also be tested for Chlamydia. Testing for other STIs is recommended if you are sexually active and at increased risk.  Osteoporosis is a disease in which the bones lose minerals and strength with aging. This can result in serious bone fractures. The risk of osteoporosis can be identified using a bone density scan. Women ages 42 and over and women at risk for fractures or osteoporosis should discuss screening with their caregivers. Ask your caregiver whether you should be taking a calcium supplement or vitamin D to reduce the rate of osteoporosis.  Menopause can be associated with physical symptoms and risks. Hormone replacement therapy is available to decrease symptoms and risks. You should talk to your caregiver about whether hormone replacement therapy is right for you.  Use  sunscreen. Apply sunscreen liberally and repeatedly throughout the day. You should seek shade when your shadow is shorter than you. Protect yourself by wearing long sleeves, pants, a wide-brimmed hat, and sunglasses year round, whenever you are outdoors.  Notify your caregiver of new moles or changes in moles, especially if there is a change in shape or color. Also notify your caregiver if a mole is larger than the size of a pencil eraser.  Stay current with your immunizations. Document Released: 07/23/2010 Document Revised: 05/04/2012 Document Reviewed: 07/23/2010 [facility] Patient Information 2014 [place].

## 2013-02-11 NOTE — Progress Notes (Signed)
Pre-visit discussion using our clinic review tool. No additional management support is needed unless otherwise documented below in the visit note.  
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
## 2013-02-16 ENCOUNTER — Telehealth: Payer: Self-pay

## 2013-02-16 ENCOUNTER — Encounter: Payer: Self-pay | Admitting: Obstetrics & Gynecology

## 2013-02-16 NOTE — Telephone Encounter (Signed)
The patient called and is hoping speak to the [place] at her earliest convenience.

## 2013-02-24 ENCOUNTER — Ambulatory Visit (INDEPENDENT_AMBULATORY_CARE_PROVIDER_SITE_OTHER): Payer: 59 | Admitting: Physician Assistant

## 2013-02-24 ENCOUNTER — Encounter: Payer: Self-pay | Admitting: Gastroenterology

## 2013-02-24 ENCOUNTER — Telehealth: Payer: Self-pay | Admitting: Physician Assistant

## 2013-02-24 ENCOUNTER — Other Ambulatory Visit (INDEPENDENT_AMBULATORY_CARE_PROVIDER_SITE_OTHER): Payer: 59

## 2013-02-24 ENCOUNTER — Other Ambulatory Visit: Payer: Self-pay | Admitting: *Deleted

## 2013-02-24 ENCOUNTER — Encounter: Payer: Self-pay | Admitting: Physician Assistant

## 2013-02-24 VITALS — BP 110/70 | HR 73 | Temp 98.2°F | Resp 14 | Ht 61.0 in | Wt 162.4 lb

## 2013-02-24 DIAGNOSIS — N390 Urinary tract infection, site not specified: Secondary | ICD-10-CM

## 2013-02-24 DIAGNOSIS — B354 Tinea corporis: Secondary | ICD-10-CM

## 2013-02-24 DIAGNOSIS — M810 Age-related osteoporosis without current pathological fracture: Secondary | ICD-10-CM

## 2013-02-24 DIAGNOSIS — Z0000 Encounter for general adult medical examination without abnormal findings: Secondary | ICD-10-CM

## 2013-02-24 DIAGNOSIS — Z1231 Encounter for screening mammogram for malignant neoplasm of breast: Secondary | ICD-10-CM

## 2013-02-24 LAB — CBC WITH DIFFERENTIAL/PLATELET
BASOS ABS: 0 10*3/uL (ref 0.0–0.1)
Basophils Relative: 0.5 % (ref 0.0–3.0)
EOS ABS: 0.1 10*3/uL (ref 0.0–0.7)
Eosinophils Relative: 2.4 % (ref 0.0–5.0)
HEMATOCRIT: 40.2 % (ref 36.0–46.0)
Hemoglobin: 13.4 g/dL (ref 12.0–15.0)
LYMPHS ABS: 1.4 10*3/uL (ref 0.7–4.0)
Lymphocytes Relative: 32.8 % (ref 12.0–46.0)
MCHC: 33.4 g/dL (ref 30.0–36.0)
MCV: 88.4 fl (ref 78.0–100.0)
MONO ABS: 0.3 10*3/uL (ref 0.1–1.0)
Monocytes Relative: 8 % (ref 3.0–12.0)
NEUTROS PCT: 56.3 % (ref 43.0–77.0)
Neutro Abs: 2.5 10*3/uL (ref 1.4–7.7)
PLATELETS: 280 10*3/uL (ref 150.0–400.0)
RBC: 4.55 Mil/uL (ref 3.87–5.11)
RDW: 14.1 % (ref 11.5–14.6)
WBC: 4.4 10*3/uL — AB (ref 4.5–10.5)

## 2013-02-24 LAB — HEPATIC FUNCTION PANEL
ALBUMIN: 3.8 g/dL (ref 3.5–5.2)
ALT: 20 U/L (ref 0–35)
AST: 20 U/L (ref 0–37)
Alkaline Phosphatase: 79 U/L (ref 39–117)
BILIRUBIN TOTAL: 0.7 mg/dL (ref 0.3–1.2)
Bilirubin, Direct: 0.1 mg/dL (ref 0.0–0.3)
Total Protein: 7.4 g/dL (ref 6.0–8.3)

## 2013-02-24 LAB — URINALYSIS, ROUTINE W REFLEX MICROSCOPIC
BILIRUBIN URINE: NEGATIVE
HGB URINE DIPSTICK: NEGATIVE
Ketones, ur: NEGATIVE
Leukocytes, UA: NEGATIVE
NITRITE: POSITIVE — AB
RBC / HPF: NONE SEEN (ref 0–?)
Specific Gravity, Urine: 1.015 (ref 1.000–1.030)
UROBILINOGEN UA: 1 (ref 0.0–1.0)
Urine Glucose: NEGATIVE
WBC UA: NONE SEEN (ref 0–?)
pH: 8 (ref 5.0–8.0)

## 2013-02-24 LAB — LIPID PANEL
Cholesterol: 187 mg/dL (ref 0–200)
HDL: 54 mg/dL (ref >39.00)
LDL CALC: 101 mg/dL — AB (ref 0–99)
Total CHOL/HDL Ratio: 3
Triglycerides: 158 mg/dL — ABNORMAL HIGH (ref 0.0–149.0)
VLDL: 31.6 mg/dL (ref 0.0–40.0)

## 2013-02-24 LAB — BASIC METABOLIC PANEL
BUN: 11 mg/dL (ref 6–23)
CALCIUM: 9.4 mg/dL (ref 8.4–10.5)
CO2: 27 mEq/L (ref 19–32)
Chloride: 104 mEq/L (ref 96–112)
Creatinine, Ser: 0.7 mg/dL (ref 0.4–1.2)
GFR: 90.19 mL/min (ref >60.00)
GLUCOSE: 102 mg/dL — AB (ref 70–99)
POTASSIUM: 4 meq/L (ref 3.5–5.1)
SODIUM: 138 meq/L (ref 135–145)

## 2013-02-24 LAB — TSH: TSH: 1.26 u[IU]/mL (ref 0.35–5.50)

## 2013-02-24 MED ORDER — DENOSUMAB 60 MG/ML ~~LOC~~ SOLN: 60.0000 mg | Freq: Once | SUBCUTANEOUS | Status: DC

## 2013-02-24 MED ORDER — CLOTRIMAZOLE-BETAMETHASONE 1-0.05 % EX CREA: 1.0000 "application " | TOPICAL_CREAM | Freq: Two times a day (BID) | CUTANEOUS | Status: DC

## 2013-02-24 NOTE — Patient Instructions (Signed)
Labs have been ordered for you, when you report to lab please be fasting.     Body Ringworm Ringworm (tinea corporis) is a fungal infection of the skin on the body. This infection is not caused by worms, but is actually caused by a fungus. Fungus normally lives on the top of your skin and can be useful. However, in the case of ringworms, the fungus grows out of control and causes a skin infection. It can involve any area of skin on the body and can spread easily from one person to another (contagious). Ringworm is a common problem for children, but it can affect adults as well. Ringworm is also often found in athletes, especially wrestlers who share equipment and mats.  CAUSES  Ringworm of the body is caused by a fungus called dermatophyte. It can spread by:  Touchingother people who are infected.  Touchinginfected pets.  Touching or sharingobjects that have been in contact with the infected person or pet (hats, combs, towels, clothing, sports equipment). SYMPTOMS   Itchy, raised red spots and bumps on the skin.  Ring-shaped rash.  Redness near the border of the rash with a clear center.  Dry and scaly skin on or around the rash. Not every person develops a ring-shaped rash. Some develop only the red, scaly patches. DIAGNOSIS  Most often, ringworm can be diagnosed by performing a skin exam. Your caregiver may choose to take a skin scraping from the affected area. The sample will be examined under the microscope to see if the fungus is present.  TREATMENT  Body ringworm may be treated with a topical antifungal cream or ointment. Sometimes, an antifungal shampoo that can be used on your body is prescribed. You may be prescribed antifungal medicines to take by mouth if your ringworm is severe, keeps coming back, or lasts a long time.  HOME CARE INSTRUCTIONS   Only take over-the-counter or prescription medicines as directed by your caregiver.  Wash the infected area and dry it  completely before applying yourcream or ointment.  When using antifungal shampoo to treat the ringworm, leave the shampoo on the body for 3 5 minutes before rinsing.   Wear loose clothing to stop clothes from rubbing and irritating the rash.  Wash or change your bed sheets every night while you have the rash.  Have your pet treated by your veterinarian if it has the same infection. To prevent ringworm:   Practice good hygiene.  Wear sandals or shoes in public places and showers.  Do not share personal items with others.  Avoid touching red patches of skin on other people.  Avoid touching pets that have bald spots or wash your hands after doing so. SEEK MEDICAL CARE IF:   Your rash continues to spread after 7 days of treatment.  Your rash is not gone in 4 weeks.  The area around your rash becomes red, warm, tender, and swollen. Document Released: 01/05/2000 Document Revised: 10/02/2011 Document Reviewed: 07/22/2011 [facility] Patient Information 2014 [place].    Health Maintenance, Female A healthy lifestyle and preventative care can promote health and wellness.  Maintain regular health, dental, and eye exams.  Eat a healthy diet. Foods like vegetables, fruits, whole grains, low-fat dairy products, and lean protein foods contain the nutrients you need without too many calories. Decrease your intake of foods high in solid fats, added sugars, and salt. Get information about a proper diet from your caregiver, if necessary.  Regular physical exercise is one of the most important  things you can do for your health. Most adults should get at least 150 minutes of moderate-intensity exercise (any activity that increases your heart rate and causes you to sweat) each week. In addition, most adults need muscle-strengthening exercises on 2 or more days a week.   Maintain a healthy weight. The body mass index (BMI) is a screening tool to identify possible weight problems. It  provides an estimate of body fat based on height and weight. Your caregiver can help determine your BMI, and can help you achieve or maintain a healthy weight. For adults 20 years and older:  A BMI below 18.5 is considered underweight.  A BMI of 18.5 to 24.9 is normal.  A BMI of 25 to 29.9 is considered overweight.  A BMI of 30 and above is considered obese.  Maintain normal blood lipids and cholesterol by exercising and minimizing your intake of saturated fat. Eat a balanced diet with plenty of fruits and vegetables. Blood tests for lipids and cholesterol should begin at age 73 and be repeated every 5 years. If your lipid or cholesterol levels are high, you are over 50, or you are a high risk for heart disease, you may need your cholesterol levels checked more frequently.Ongoing high lipid and cholesterol levels should be treated with medicines if diet and exercise are not effective.  If you smoke, find out from your caregiver how to quit. If you do not use tobacco, do not start.  Lung cancer screening is recommended for adults aged 40 80 years who are at high risk for developing lung cancer because of a history of smoking. Yearly low-dose computed tomography (CT) is recommended for people who have at least a 30-pack-year history of smoking and are a current smoker or have quit within the past 15 years. A pack year of smoking is smoking an average of 1 pack of cigarettes a day for 1 year (for example: 1 pack a day for 30 years or 2 packs a day for 15 years). Yearly screening should continue until the smoker has stopped smoking for at least 15 years. Yearly screening should also be stopped for people who develop a health problem that would prevent them from having lung cancer treatment.  If you are pregnant, do not drink alcohol. If you are breastfeeding, be very cautious about drinking alcohol. If you are not pregnant and choose to drink alcohol, do not exceed 1 drink per day. One drink is  considered to be 12 ounces (355 mL) of beer, 5 ounces (148 mL) of wine, or 1.5 ounces (44 mL) of liquor.  Avoid use of street drugs. Do not share needles with anyone. Ask for help if you need support or instructions about stopping the use of drugs.  High blood pressure causes heart disease and increases the risk of stroke. Blood pressure should be checked at least every 1 to 2 years. Ongoing high blood pressure should be treated with medicines, if weight loss and exercise are not effective.  If you are 43 to 62 years old, ask your caregiver if you should take aspirin to prevent strokes.  Diabetes screening involves taking a blood sample to check your fasting blood sugar level. This should be done once every 3 years, after age 65, if you are within normal weight and without risk factors for diabetes. Testing should be considered at a younger age or be carried out more frequently if you are overweight and have at least 1 risk factor for diabetes.  Breast cancer  screening is essential preventative care for women. You should practice "breast self-awareness." This means understanding the normal appearance and feel of your breasts and may include breast self-examination. Any changes detected, no matter how small, should be reported to a caregiver. Women in their 78s and 30s should have a clinical breast exam (CBE) by a caregiver as part of a regular health exam every 1 to 3 years. After age 31, women should have a CBE every year. Starting at age 70, women should consider having a mammogram (breast X-ray) every year. Women who have a family history of breast cancer should talk to their caregiver about genetic screening. Women at a high risk of breast cancer should talk to their caregiver about having an MRI and a mammogram every year.  Breast cancer gene (BRCA)-related cancer risk assessment is recommended for women who have family members with BRCA-related cancers. BRCA-related cancers include breast, ovarian,  tubal, and peritoneal cancers. Having family members with these cancers may be associated with an increased risk for harmful changes (mutations) in the breast cancer genes BRCA1 and BRCA2. Results of the assessment will determine the need for genetic counseling and BRCA1 and BRCA2 testing.  The Pap test is a screening test for cervical cancer. Women should have a Pap test starting at age 12. Between ages 26 and 71, Pap tests should be repeated every 2 years. Beginning at age 58, you should have a Pap test every 3 years as long as the past 3 Pap tests have been normal. If you had a hysterectomy for a problem that was not cancer or a condition that could lead to cancer, then you no longer need Pap tests. If you are between ages 42 and 19, and you have had normal Pap tests going back 10 years, you no longer need Pap tests. If you have had past treatment for cervical cancer or a condition that could lead to cancer, you need Pap tests and screening for cancer for at least 20 years after your treatment. If Pap tests have been discontinued, risk factors (such as a new sexual partner) need to be reassessed to determine if screening should be resumed. Some women have medical problems that increase the chance of getting cervical cancer. In these cases, your caregiver may recommend more frequent screening and Pap tests.  The human papillomavirus (HPV) test is an additional test that may be used for cervical cancer screening. The HPV test looks for the virus that can cause the cell changes on the cervix. The cells collected during the Pap test can be tested for HPV. The HPV test could be used to screen women aged 2 years and older, and should be used in women of any age who have unclear Pap test results. After the age of 34, women should have HPV testing at the same frequency as a Pap test.  Colorectal cancer can be detected and often prevented. Most routine colorectal cancer screening begins at the age of 5 and  continues through age 56. However, your caregiver may recommend screening at an earlier age if you have risk factors for colon cancer. On a yearly basis, your caregiver may provide home test kits to check for hidden blood in the stool. Use of a small camera at the end of a tube, to directly examine the colon (sigmoidoscopy or colonoscopy), can detect the earliest forms of colorectal cancer. Talk to your caregiver about this at age 71, when routine screening begins. Direct examination of the colon should be repeated  every 5 to 10 years through age 49, unless early forms of pre-cancerous polyps or small growths are found.  Hepatitis C blood testing is recommended for all people born from [birth year] through 1964 and any individual with known risks for hepatitis C.  Practice safe sex. Use condoms and avoid high-risk sexual practices to reduce the spread of sexually transmitted infections (STIs). Sexually active women aged 47 and younger should be checked for Chlamydia, which is a common sexually transmitted infection. Older women with new or multiple partners should also be tested for Chlamydia. Testing for other STIs is recommended if you are sexually active and at increased risk.  Osteoporosis is a disease in which the bones lose minerals and strength with aging. This can result in serious bone fractures. The risk of osteoporosis can be identified using a bone density scan. Women ages 52 and over and women at risk for fractures or osteoporosis should discuss screening with their caregivers. Ask your caregiver whether you should be taking a calcium supplement or vitamin D to reduce the rate of osteoporosis.  Menopause can be associated with physical symptoms and risks. Hormone replacement therapy is available to decrease symptoms and risks. You should talk to your caregiver about whether hormone replacement therapy is right for you.  Use sunscreen. Apply sunscreen liberally and repeatedly throughout the day. You  should seek shade when your shadow is shorter than you. Protect yourself by wearing long sleeves, pants, a wide-brimmed hat, and sunglasses year round, whenever you are outdoors.  Notify your caregiver of new moles or changes in moles, especially if there is a change in shape or color. Also notify your caregiver if a mole is larger than the size of a pencil eraser.  Stay current with your immunizations. Document Released: 07/23/2010 Document Revised: 05/04/2012 Document Reviewed: 07/23/2010 [facility] Patient Information 2014 [place].

## 2013-02-24 NOTE — Telephone Encounter (Signed)
Patient would also like a referral for endoscopy.
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
## 2013-02-24 NOTE — Telephone Encounter (Signed)
Patient would like a referral for mammogram and would like to make sure that a bone density appointment is made.  Please call in regards.

## 2013-02-25 NOTE — Telephone Encounter (Signed)
Called pt left message on VM for pt to return call to advise of [employer].

## 2013-02-25 NOTE — Telephone Encounter (Signed)
Please phone patient and inform her the referral for dexa and mammography have been placed. Patient care coordinators are working on setting up. She has appointment schedule 03/26/13 with GI can discuss endoscopy then.

## 2013-02-25 NOTE — Telephone Encounter (Signed)
This is a [address] pt and has not been seen and does not have a follow up apt in our [place] office, forwarding message to Dr Caryl Comes for review

## 2013-02-26 MED ORDER — NITROFURANTOIN MONOHYD MACRO 100 MG PO CAPS: 100.0000 mg | ORAL_CAPSULE | Freq: Two times a day (BID) | ORAL | Status: DC

## 2013-03-02 ENCOUNTER — Telehealth: Payer: Self-pay | Admitting: Physician Assistant

## 2013-03-02 NOTE — Telephone Encounter (Signed)
Patient phoned requesting results from labs drawn 02/2013.  Please advise.

## 2013-03-02 NOTE — Telephone Encounter (Signed)
Pt request phone call from the assistant for more information about the lab work that was done last week 02/26/13. Please call pt

## 2013-03-03 NOTE — Telephone Encounter (Signed)
Notified patient of provider response and action.

## 2013-03-04 ENCOUNTER — Ambulatory Visit: Payer: 59 | Admitting: Internal Medicine

## 2013-03-08 ENCOUNTER — Ambulatory Visit (INDEPENDENT_AMBULATORY_CARE_PROVIDER_SITE_OTHER): Payer: 59 | Admitting: Family Medicine

## 2013-03-08 VITALS — BP 110/72 | HR 73 | Temp 98.7°F | Resp 18 | Ht 61.0 in | Wt 162.0 lb

## 2013-03-08 DIAGNOSIS — J069 Acute upper respiratory infection, unspecified: Secondary | ICD-10-CM

## 2013-03-08 DIAGNOSIS — J020 Streptococcal pharyngitis: Secondary | ICD-10-CM

## 2013-03-08 DIAGNOSIS — J029 Acute pharyngitis, unspecified: Secondary | ICD-10-CM

## 2013-03-08 DIAGNOSIS — R059 Cough, unspecified: Secondary | ICD-10-CM

## 2013-03-08 DIAGNOSIS — R05 Cough: Secondary | ICD-10-CM

## 2013-03-08 LAB — POCT RAPID STREP A (OFFICE): Rapid Strep A Screen: NEGATIVE

## 2013-03-08 MED ORDER — MAGIC MOUTHWASH W/LIDOCAINE: 5.0000 mL | Freq: Four times a day (QID) | ORAL | Status: DC | PRN
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
## 2013-03-08 NOTE — Patient Instructions (Addendum)
Drink plenty of fluids, sore throat lozenges, tylenol if needed. If any fever, or other worsening of your chest symptoms - return here or emergency room.   Upper Respiratory Infection, Adult An upper respiratory infection (URI) is also sometimes known as the common cold. The upper respiratory tract includes the nose, sinuses, throat, trachea, and bronchi. Bronchi are the airways leading to the lungs. Most people improve within 1 week, but symptoms can last up to 2 weeks. A residual cough may last even longer.  CAUSES Many different viruses can infect the tissues lining the upper respiratory tract. The tissues become irritated and inflamed and often become very moist. Mucus production is also common. A cold is contagious. You can easily spread the virus to others by oral contact. This includes kissing, sharing a glass, coughing, or sneezing. Touching your mouth or nose and then touching a surface, which is then touched by another person, can also spread the virus. SYMPTOMS  Symptoms typically develop 1 to 3 days after you come in contact with a cold virus. Symptoms vary from person to person. They may include:  Runny nose.  Sneezing.  Nasal congestion.  Sinus irritation.  Sore throat.  Loss of voice (laryngitis).  Cough.  Fatigue.  Muscle aches.  Loss of appetite.  Headache.  Low-grade fever. DIAGNOSIS  You might diagnose your own cold based on familiar symptoms, since most people get a cold 2 to 3 times a year. Your caregiver can confirm this based on your exam. Most importantly, your caregiver can check that your symptoms are not due to another disease such as strep throat, sinusitis, pneumonia, asthma, or epiglottitis. Blood tests, throat tests, and X-rays are not necessary to diagnose a common cold, but they may sometimes be helpful in excluding other more serious diseases. Your caregiver will decide if any further tests are required. RISKS AND COMPLICATIONS  You may be at risk  for a more severe case of the common cold if you smoke cigarettes, have chronic heart disease (such as heart failure) or lung disease (such as asthma), or if you have a weakened immune system. The very young and very old are also at risk for more serious infections. Bacterial sinusitis, middle ear infections, and bacterial pneumonia can complicate the common cold. The common cold can worsen asthma and chronic obstructive pulmonary disease (COPD). Sometimes, these complications can require emergency medical care and may be life-threatening. PREVENTION  The best way to protect against getting a cold is to practice good hygiene. Avoid oral or hand contact with people with cold symptoms. Wash your hands often if contact occurs. There is no clear evidence that vitamin C, vitamin E, echinacea, or exercise reduces the chance of developing a cold. However, it is always recommended to get plenty of rest and practice good nutrition. TREATMENT  Treatment is directed at relieving symptoms. There is no cure. Antibiotics are not effective, because the infection is caused by a virus, not by bacteria. Treatment may include:  Increased fluid intake. Sports drinks offer valuable electrolytes, sugars, and fluids.  Breathing heated mist or steam (vaporizer or shower).  Eating chicken soup or other clear broths, and maintaining good nutrition.  Getting plenty of rest.  Using gargles or lozenges for comfort.  Controlling fevers with ibuprofen or acetaminophen as directed by your caregiver.  Increasing usage of your inhaler if you have asthma. Zinc gel and zinc lozenges, taken in the first 24 hours of the common cold, can shorten the duration and lessen the severity  of symptoms. Pain medicines may help with fever, muscle aches, and throat pain. A variety of non-prescription medicines are available to treat congestion and runny nose. Your caregiver can make recommendations and may suggest nasal or lung inhalers for other  symptoms.  HOME CARE INSTRUCTIONS   Only take over-the-counter or prescription medicines for pain, discomfort, or fever as directed by your caregiver.  Use a warm mist humidifier or inhale steam from a shower to increase air moisture. This may keep secretions moist and make it easier to breathe.  Drink enough water and fluids to keep your urine clear or pale yellow.  Rest as needed.  Return to work when your temperature has returned to normal or as your caregiver advises. You may need to stay home longer to avoid infecting others. You can also use a face mask and careful hand washing to prevent spread of the virus. SEEK MEDICAL CARE IF:   After the first few days, you feel you are getting worse rather than better.  You need your caregiver's advice about medicines to control symptoms.  You develop chills, worsening shortness of breath, or brown or red sputum. These may be signs of pneumonia.  You develop yellow or brown nasal discharge or pain in the face, especially when you bend forward. These may be signs of sinusitis.  You develop a fever, swollen neck glands, pain with swallowing, or white areas in the back of your throat. These may be signs of strep throat. SEEK IMMEDIATE MEDICAL CARE IF:   You have a fever.  You develop severe or persistent headache, ear pain, sinus pain, or chest pain.  You develop wheezing, a prolonged cough, cough up blood, or have a change in your usual mucus (if you have chronic lung disease).  You develop sore muscles or a stiff neck. Document Released: 07/03/2000 Document Revised: 04/01/2011 Document Reviewed: 05/11/2010 [facility] Patient Information 2014 [place], [place]. Sore Throat A sore throat is pain, burning, irritation, or scratchiness of the throat. There is often pain or tenderness when swallowing or talking. A sore throat may be accompanied by other symptoms, such as coughing, sneezing, fever, and swollen neck glands. A sore throat is often  the first sign of another sickness, such as a cold, flu, strep throat, or mononucleosis (commonly known as mono). Most sore throats go away without medical treatment. CAUSES  The most common causes of a sore throat include:  A viral infection, such as a cold, flu, or mono.  A bacterial infection, such as strep throat, tonsillitis, or whooping cough.  Seasonal allergies.  Dryness in the air.  Irritants, such as smoke or pollution.  Gastroesophageal reflux disease (GERD). HOME CARE INSTRUCTIONS   Only take over-the-counter medicines as directed by your caregiver.  Drink enough fluids to keep your urine clear or pale yellow.  Rest as needed.  Try using throat sprays, lozenges, or sucking on hard candy to ease any pain (if older than 4 years or as directed).  Sip warm liquids, such as broth, herbal tea, or warm water with honey to relieve pain temporarily. You may also eat or drink cold or frozen liquids such as frozen ice pops.  Gargle with salt water (mix 1 tsp salt with 8 oz of water).  Do not smoke and avoid secondhand smoke.  Put a cool-mist humidifier in your bedroom at night to moisten the air. You can also turn on a hot shower and sit in the bathroom with the door closed for 5 10 minutes. SEEK  IMMEDIATE MEDICAL CARE IF:  You have difficulty breathing.  You are unable to swallow fluids, soft foods, or your saliva.  You have increased swelling in the throat.  Your sore throat does not get better in 7 days.  You have nausea and vomiting.  You have a fever or persistent symptoms for more than 2 3 days.  You have a fever and your symptoms suddenly get worse. MAKE SURE YOU:   Understand these instructions.  Will watch your condition.  Will get help right away if you are not doing well or get worse. Document Released: 02/15/2004 Document Revised: 12/25/2011 Document Reviewed: 09/15/2011 [facility] Patient Information 2014 [place], [place].

## 2013-03-10 ENCOUNTER — Ambulatory Visit (INDEPENDENT_AMBULATORY_CARE_PROVIDER_SITE_OTHER): Payer: 59 | Admitting: Family Medicine

## 2013-03-10 VITALS — BP 120/72 | HR 66 | Temp 98.1°F | Resp 18 | Ht 61.0 in | Wt 161.0 lb

## 2013-03-10 DIAGNOSIS — R059 Cough, unspecified: Secondary | ICD-10-CM

## 2013-03-10 DIAGNOSIS — M791 Myalgia, unspecified site: Secondary | ICD-10-CM

## 2013-03-10 DIAGNOSIS — R05 Cough: Secondary | ICD-10-CM

## 2013-03-10 DIAGNOSIS — R51 Headache: Secondary | ICD-10-CM

## 2013-03-10 DIAGNOSIS — IMO0001 Reserved for inherently not codable concepts without codable children: Secondary | ICD-10-CM

## 2013-03-10 DIAGNOSIS — J029 Acute pharyngitis, unspecified: Secondary | ICD-10-CM

## 2013-03-10 LAB — CULTURE, GROUP A STREP: ORGANISM ID, BACTERIA: NORMAL

## 2013-03-10 LAB — POCT CBC
GRANULOCYTE PERCENT: 54.8 % (ref 37–80)
HEMATOCRIT: 37.8 % (ref 37.7–47.9)
HEMOGLOBIN: 11.8 g/dL — AB (ref 12.2–16.2)
Lymph, poc: 1.8 (ref 0.6–3.4)
MCH: 29.1 pg (ref 27–31.2)
MCHC: 31.2 g/dL — AB (ref 31.8–35.4)
MCV: 93 fL (ref 80–97)
MID (CBC): 0.4 (ref 0–0.9)
MPV: 9.1 fL (ref 0–99.8)
POC Granulocyte: 2.7 (ref 2–6.9)
POC LYMPH PERCENT: 36.9 %L (ref 10–50)
POC MID %: 8.3 %M (ref 0–12)
Platelet Count, POC: 221 10*3/uL (ref 142–424)
RBC: 4.06 M/uL (ref 4.04–5.48)
RDW, POC: 13.7 %
WBC: 4.9 10*3/uL (ref 4.6–10.2)

## 2013-03-10 LAB — POCT INFLUENZA A/B
INFLUENZA B, POC: NEGATIVE
Influenza A, POC: NEGATIVE
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
## 2013-03-10 NOTE — Patient Instructions (Signed)
Try the magic mouthwash for sore throat, dayquil, nyquil ok.  Fluids and rest. This still could be influenza, but treating the symptoms as we discussed. Return to the clinic or go to the nearest emergency room if any of your symptoms worsen or new symptoms occur.

## 2013-03-11 ENCOUNTER — Encounter: Payer: 59 | Admitting: Medical

## 2013-03-19 ENCOUNTER — Telehealth: Payer: Self-pay

## 2013-03-19 NOTE — Telephone Encounter (Signed)
Please call patient. Patient requesting medical records. Patient will come in and sign release form.Gwendolyn KitchenMarland [name]

## 2013-03-22 NOTE — Telephone Encounter (Signed)
Left message to call back. Checking to see if she was still going to sign a release for her medical records. Did not see one on my desk already.

## 2013-03-25 ENCOUNTER — Encounter: Payer: Self-pay | Admitting: Internal Medicine

## 2013-03-25 ENCOUNTER — Ambulatory Visit (INDEPENDENT_AMBULATORY_CARE_PROVIDER_SITE_OTHER): Payer: 59 | Admitting: Internal Medicine

## 2013-03-25 VITALS — BP 107/69 | HR 73 | Ht 61.0 in | Wt 157.0 lb

## 2013-03-25 DIAGNOSIS — R079 Chest pain, unspecified: Secondary | ICD-10-CM

## 2013-03-25 NOTE — Patient Instructions (Signed)
Your physician recommends that you continue on your current medications as directed. Please refer to the Current Medication list given to you today. Your physician has requested that you have a lexiscan myoview. For further information please visit [email or web address]. Please follow instruction sheet, as given.  
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
## 2013-03-26 ENCOUNTER — Encounter: Payer: Self-pay | Admitting: Gastroenterology

## 2013-03-26 ENCOUNTER — Telehealth: Payer: Self-pay | Admitting: Physician Assistant

## 2013-03-26 ENCOUNTER — Telehealth: Payer: Self-pay | Admitting: Internal Medicine

## 2013-03-26 ENCOUNTER — Ambulatory Visit (INDEPENDENT_AMBULATORY_CARE_PROVIDER_SITE_OTHER): Payer: 59 | Admitting: Gastroenterology

## 2013-03-26 VITALS — BP 118/78 | HR 69 | Ht 61.0 in

## 2013-03-26 DIAGNOSIS — K219 Gastro-esophageal reflux disease without esophagitis: Secondary | ICD-10-CM

## 2013-03-26 DIAGNOSIS — Z800 Family history of malignant neoplasm of digestive organs: Secondary | ICD-10-CM
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
## 2013-03-26 NOTE — Patient Instructions (Signed)
We have faxed over to get your records to get the colonoscopy results that you had in the past After you have your surgery and are able, contact the office to try and schedule your colonoscopy around June

## 2013-03-26 NOTE — Telephone Encounter (Signed)
Patient had an office visit with GI.  She states that she wasted her time b.c the physician did not feel like she should have an endoscopy. Patient also states that physician wanted records from previous practice in order to discuss a colonoscopy  but she states we already had them in the system.  [facility] bc they do not do mammograms.  She is requesting new referrals.  Please follow up with patient. She also wanted to let know that she is having a pelvic mesh surgery on the 20th of March at [facility].

## 2013-03-26 NOTE — Assessment & Plan Note (Signed)
Symptoms are well-controlled with omeprazole.  Plan to continue with the same. 

## 2013-03-26 NOTE — Assessment & Plan Note (Signed)
Plan to obtain her prior colonoscopy records to determine whether it has been 5 years since her last exam.  Exam will be deferred until her cardiology workup is complete and she is fully recovered from her surgery.

## 2013-03-26 NOTE — Telephone Encounter (Signed)
New Problem:  Pt is requesting our office mail her information on her nuc study.. States she does not have internet to research it.

## 2013-03-26 NOTE — Telephone Encounter (Signed)
Patient guide mailed to her home address.

## 2013-03-30 ENCOUNTER — Other Ambulatory Visit: Payer: Self-pay | Admitting: Physician Assistant

## 2013-03-30 DIAGNOSIS — R928 Other abnormal and inconclusive findings on diagnostic imaging of breast: Secondary | ICD-10-CM

## 2013-03-31 NOTE — Telephone Encounter (Signed)
Mary,  Could you please look into her referrals? Thanks.

## 2013-03-31 NOTE — Telephone Encounter (Signed)
Done

## 2013-04-01 DIAGNOSIS — G4733 Obstructive sleep apnea (adult) (pediatric): Secondary | ICD-10-CM | POA: Insufficient documentation

## 2013-04-01 DIAGNOSIS — K219 Gastro-esophageal reflux disease without esophagitis: Secondary | ICD-10-CM | POA: Insufficient documentation

## 2013-04-02 DIAGNOSIS — Z8249 Family history of ischemic heart disease and other diseases of the circulatory system: Secondary | ICD-10-CM | POA: Insufficient documentation

## 2013-04-05 ENCOUNTER — Telehealth: Payer: Self-pay | Admitting: Internal Medicine

## 2013-04-05 LAB — HM DEXA SCAN

## 2013-04-07 ENCOUNTER — Telehealth: Payer: Self-pay | Admitting: *Deleted

## 2013-04-07 ENCOUNTER — Ambulatory Visit (HOSPITAL_COMMUNITY): Payer: 59 | Attending: Cardiovascular Disease | Admitting: Radiology

## 2013-04-07 VITALS — BP 101/57 | Ht 61.0 in | Wt 155.0 lb

## 2013-04-07 DIAGNOSIS — R079 Chest pain, unspecified: Secondary | ICD-10-CM | POA: Insufficient documentation

## 2013-04-07 DIAGNOSIS — Z01810 Encounter for preprocedural cardiovascular examination: Secondary | ICD-10-CM | POA: Insufficient documentation

## 2013-04-07 DIAGNOSIS — E785 Hyperlipidemia, unspecified: Secondary | ICD-10-CM | POA: Insufficient documentation

## 2013-04-07 DIAGNOSIS — R0989 Other specified symptoms and signs involving the circulatory and respiratory systems: Secondary | ICD-10-CM | POA: Insufficient documentation

## 2013-04-07 DIAGNOSIS — R0609 Other forms of dyspnea: Secondary | ICD-10-CM | POA: Insufficient documentation

## 2013-04-07 DIAGNOSIS — Z8249 Family history of ischemic heart disease and other diseases of the circulatory system: Secondary | ICD-10-CM | POA: Insufficient documentation

## 2013-04-07 DIAGNOSIS — R0602 Shortness of breath: Secondary | ICD-10-CM | POA: Insufficient documentation

## 2013-04-07 DIAGNOSIS — I10 Essential (primary) hypertension: Secondary | ICD-10-CM | POA: Insufficient documentation

## 2013-04-07 MED ORDER — TECHNETIUM TC 99M SESTAMIBI GENERIC - CARDIOLITE
10.0000 | Freq: Once | INTRAVENOUS | Status: AC | PRN
  Administered 2013-04-07: 10 via INTRAVENOUS

## 2013-04-07 MED ORDER — REGADENOSON 0.4 MG/5ML IV SOLN
0.4000 mg | Freq: Once | INTRAVENOUS | Status: AC
  Administered 2013-04-07: 0.4 mg via INTRAVENOUS

## 2013-04-07 MED ORDER — TECHNETIUM TC 99M SESTAMIBI GENERIC - CARDIOLITE
30.0000 | Freq: Once | INTRAVENOUS | Status: AC | PRN
  Administered 2013-04-07: 30 via INTRAVENOUS

## 2013-04-07 NOTE — Telephone Encounter (Signed)
Pt called concerned because she received a call to schedule a mammogram.  She further states she was unaware she needed a mammogram.  Pt is very anxious.  Please advise

## 2013-04-07 NOTE — Progress Notes (Signed)
[place] [facility] NUCLEAR MED [address] [place], [place] [address] [phone]    Cardiology Nuclear Med Study  [name] is a 63 y.o. female     MRN : [id number]     DOB: 1950-07-21  Procedure Date: 04/07/2013  Nuclear Med Background Indication for Stress Test:  Evaluation for Ischemia, and Surgical Clearance for Removal OP Pelvic Mesh on 04-09-13 by Dr. Alona Bene ([place]) History:  '07 MPI: NL EF: 74%, 6/12 NL Cath EF: 65% Cardiac Risk Factors: Family History - CAD, Hypertension and Lipids  Symptoms:  Chest Pain, DOE and SOB   Nuclear Pre-Procedure Caffeine/Decaff Intake:  None > 12 hrs NPO After: 11:00pm on 04-05-13  Lungs:  clear O2 Sat: 94-97% on room air. IV 0.9% NS with Angio Cath:  22g  IV Site: L Antecubital x 1, tolerated well IV Started by:  Irven [place], RN  Chest Size (in):  34  Cup Size: B  Height: 5\' 1"  (1.549 m)  Weight:  155 lb (70.308 kg)  BMI:  Body mass index is 29.3 kg/(m^2). Tech Comments:  N/A    Nuclear Med Study 1 or 2 day study: 1 day  Stress Test Type:  Lexiscan  Reading MD: N/A  Order Authorizing Provider:  Virl Axe, MD  Resting Radionuclide: Technetium 42m Sestamibi  Resting Radionuclide Dose: 11.0 mCi   Stress Radionuclide:  Technetium 66m Sestamibi  Stress Radionuclide Dose: 33.0 mCi           Stress Protocol Rest HR: 60 Stress HR: 111  Rest BP: 101/57 Stress BP: 103/62  Exercise Time (min): n/a METS: n/a   Predicted Max HR: 160 bpm % Max HR: 69.38 bpm Rate Pressure Product: 11433   Dose of Adenosine (mg):  n/a Dose of Lexiscan: 0.4 mg  Dose of Atropine (mg): n/a Dose of Dobutamine: n/a mcg/kg/min (at max HR)  Stress Test Technologist: Perrin Maltese, EMT-P  Nuclear Technologist:  Evalina Field, RT-N     Rest Procedure:  Myocardial perfusion imaging was performed at rest 45 minutes following the intravenous administration of Technetium 67m Sestamibi. Rest ECG: NSR - Normal EKG  Stress  Procedure:  The patient received IV Lexiscan 0.4 mg over 15-seconds.  Technetium 47m Sestamibi injected at 30-seconds. This patient had sob, chest and throat tightness, lt. Headed, and a headache with the Lexiscan injection. Quantitative spect images were obtained after a 45 minute delay. Stress ECG: No significant change from baseline ECG  QPS Raw Data Images:  Normal; no motion artifact; normal heart/lung ratio. Stress Images:  There is decreased uptake in the apex. Rest Images:  There is decreased uptake in the apex. Subtraction (SDS):  No evidence of ischemia. Transient Ischemic Dilatation (Normal <1.22):  0.87 Lung/Heart Ratio (Normal <0.45):  0.38  Quantitative Gated Spect Images QGS EDV:  58 ml QGS ESV:  19 ml  Impression Exercise Capacity:  Lexiscan with no exercise. BP Response:  Normal blood pressure response. Clinical Symptoms:  dyspnea, chest tightness ECG Impression:  No significant ECG changes with Lexiscan. Comparison with Prior Nuclear Study: No significant change from previous study  Overall Impression:  Low risk stress nuclear study with fixed apical defect. No reversible ischemia..  LV Ejection Fraction: 68%.  LV Wall Motion:  Normal Wall Motion  Pixie Casino, MD, [facility] [facility] Board Certified in Nuclear Cardiology Attending Cardiologist [place]

## 2013-04-07 NOTE — Telephone Encounter (Signed)
[name], could you please look into this. I thought we put her in for diagnostic mammogram.

## 2013-04-09 DIAGNOSIS — F419 Anxiety disorder, unspecified: Secondary | ICD-10-CM | POA: Insufficient documentation

## 2013-04-11 DIAGNOSIS — I4891 Unspecified atrial fibrillation: Secondary | ICD-10-CM | POA: Insufficient documentation

## 2013-04-13 ENCOUNTER — Telehealth: Payer: Self-pay

## 2013-04-13 NOTE — Telephone Encounter (Signed)
Contacted patient to ask her about FMLA/disability ppw that she dropped off almost one month ago to be completed by Dr. Carlota Raspberry. States that she no longer needs him to complete this due to her being in the hospital at the moment.

## 2013-04-19 ENCOUNTER — Emergency Department (HOSPITAL_COMMUNITY)
Admission: EM | Admit: 2013-04-19 | Discharge: 2013-04-19 | Disposition: A | Discharge status: Home / Self Care | Payer: 59 | Attending: Emergency Medicine | Admitting: Emergency Medicine

## 2013-04-19 ENCOUNTER — Emergency Department (HOSPITAL_COMMUNITY): Payer: 59

## 2013-04-19 ENCOUNTER — Encounter (HOSPITAL_COMMUNITY): Payer: Self-pay | Admitting: Emergency Medicine

## 2013-04-19 DIAGNOSIS — I4891 Unspecified atrial fibrillation: Secondary | ICD-10-CM | POA: Insufficient documentation

## 2013-04-19 DIAGNOSIS — IMO0002 Reserved for concepts with insufficient information to code with codable children: Secondary | ICD-10-CM | POA: Insufficient documentation

## 2013-04-19 DIAGNOSIS — Y834 Other reconstructive surgery as the cause of abnormal reaction of the patient, or of later complication, without mention of misadventure at the time of the procedure: Secondary | ICD-10-CM | POA: Insufficient documentation

## 2013-04-19 DIAGNOSIS — G4730 Sleep apnea, unspecified: Secondary | ICD-10-CM | POA: Insufficient documentation

## 2013-04-19 DIAGNOSIS — T819XXA Unspecified complication of procedure, initial encounter: Secondary | ICD-10-CM

## 2013-04-19 DIAGNOSIS — R0602 Shortness of breath: Secondary | ICD-10-CM | POA: Insufficient documentation

## 2013-04-19 DIAGNOSIS — I209 Angina pectoris, unspecified: Secondary | ICD-10-CM | POA: Insufficient documentation

## 2013-04-19 DIAGNOSIS — E785 Hyperlipidemia, unspecified: Secondary | ICD-10-CM | POA: Insufficient documentation

## 2013-04-19 DIAGNOSIS — Z79899 Other long term (current) drug therapy: Secondary | ICD-10-CM | POA: Insufficient documentation

## 2013-04-19 DIAGNOSIS — I252 Old myocardial infarction: Secondary | ICD-10-CM | POA: Insufficient documentation

## 2013-04-19 DIAGNOSIS — I10 Essential (primary) hypertension: Secondary | ICD-10-CM | POA: Insufficient documentation

## 2013-04-19 DIAGNOSIS — Z9089 Acquired absence of other organs: Secondary | ICD-10-CM | POA: Insufficient documentation

## 2013-04-19 DIAGNOSIS — F3289 Other specified depressive episodes: Secondary | ICD-10-CM | POA: Insufficient documentation

## 2013-04-19 DIAGNOSIS — F329 Major depressive disorder, single episode, unspecified: Secondary | ICD-10-CM | POA: Insufficient documentation

## 2013-04-19 DIAGNOSIS — R630 Anorexia: Secondary | ICD-10-CM | POA: Insufficient documentation

## 2013-04-19 DIAGNOSIS — G8929 Other chronic pain: Secondary | ICD-10-CM | POA: Insufficient documentation

## 2013-04-19 DIAGNOSIS — K56609 Unspecified intestinal obstruction, unspecified as to partial versus complete obstruction: Secondary | ICD-10-CM

## 2013-04-19 DIAGNOSIS — Z8701 Personal history of pneumonia (recurrent): Secondary | ICD-10-CM | POA: Insufficient documentation

## 2013-04-19 DIAGNOSIS — M545 Low back pain, unspecified: Secondary | ICD-10-CM | POA: Insufficient documentation

## 2013-04-19 DIAGNOSIS — N9989 Other postprocedural complications and disorders of genitourinary system: Secondary | ICD-10-CM | POA: Insufficient documentation

## 2013-04-19 DIAGNOSIS — Z9889 Other specified postprocedural states: Secondary | ICD-10-CM | POA: Insufficient documentation

## 2013-04-19 DIAGNOSIS — E039 Hypothyroidism, unspecified: Secondary | ICD-10-CM | POA: Insufficient documentation

## 2013-04-19 DIAGNOSIS — Z90710 Acquired absence of both cervix and uterus: Secondary | ICD-10-CM | POA: Insufficient documentation

## 2013-04-19 DIAGNOSIS — K5900 Constipation, unspecified: Secondary | ICD-10-CM | POA: Insufficient documentation

## 2013-04-19 DIAGNOSIS — K219 Gastro-esophageal reflux disease without esophagitis: Secondary | ICD-10-CM | POA: Insufficient documentation

## 2013-04-19 DIAGNOSIS — Z7982 Long term (current) use of aspirin: Secondary | ICD-10-CM | POA: Insufficient documentation

## 2013-04-19 DIAGNOSIS — F411 Generalized anxiety disorder: Secondary | ICD-10-CM | POA: Insufficient documentation

## 2013-04-19 HISTORY — DX: Unspecified atrial fibrillation: I48.91

## 2013-04-19 LAB — CBC WITH DIFFERENTIAL/PLATELET
Basophils Absolute: 0 K/uL (ref 0.0–0.1)
Basophils Relative: 0 % (ref 0–1)
Eosinophils Absolute: 0.4 10*3/uL (ref 0.0–0.7)
Eosinophils Relative: 4 % (ref 0–5)
HCT: 38.7 % (ref 36.0–46.0)
Hemoglobin: 13.2 g/dL (ref 12.0–15.0)
Lymphocytes Relative: 18 % (ref 12–46)
Lymphs Abs: 2 K/uL (ref 0.7–4.0)
MCH: 29.6 pg (ref 26.0–34.0)
MCHC: 34.1 g/dL (ref 30.0–36.0)
MCV: 86.8 fL (ref 78.0–100.0)
Monocytes Absolute: 0.9 10*3/uL (ref 0.1–1.0)
Monocytes Relative: 8 % (ref 3–12)
Neutro Abs: 7.9 K/uL — ABNORMAL HIGH (ref 1.7–7.7)
Neutrophils Relative %: 70 % (ref 43–77)
Platelets: 497 10*3/uL — ABNORMAL HIGH (ref 150–400)
RBC: 4.46 MIL/uL (ref 3.87–5.11)
RDW: 13.7 % (ref 11.5–15.5)
WBC Morphology: INCREASED
WBC: 11.2 K/uL — ABNORMAL HIGH (ref 4.0–10.5)

## 2013-04-19 LAB — URINALYSIS, ROUTINE W REFLEX MICROSCOPIC
Bilirubin Urine: NEGATIVE
Glucose, UA: NEGATIVE mg/dL
Ketones, ur: NEGATIVE mg/dL
Nitrite: NEGATIVE
Protein, ur: 30 mg/dL — AB
Specific Gravity, Urine: 1.005 (ref 1.005–1.030)
Urobilinogen, UA: 0.2 mg/dL (ref 0.0–1.0)
pH: 7.5 (ref 5.0–8.0)

## 2013-04-19 LAB — LIPASE, BLOOD: Lipase: 18 U/L (ref 11–59)

## 2013-04-19 LAB — URINE MICROSCOPIC-ADD ON

## 2013-04-19 LAB — COMPREHENSIVE METABOLIC PANEL
ALT: 39 U/L — ABNORMAL HIGH (ref 0–35)
Albumin: 3.1 g/dL — ABNORMAL LOW (ref 3.5–5.2)
Alkaline Phosphatase: 82 U/L (ref 39–117)
CO2: 26 mEq/L (ref 19–32)
Calcium: 9.3 mg/dL (ref 8.4–10.5)
Chloride: 95 mEq/L — ABNORMAL LOW (ref 96–112)
Creatinine, Ser: 0.59 mg/dL (ref 0.50–1.10)
GFR calc Af Amer: >90 mL/min (ref >90)
GFR calc non Af Amer: >90 mL/min (ref >90)
Glucose, Bld: 118 mg/dL — ABNORMAL HIGH (ref 70–99)
Sodium: 134 mEq/L — ABNORMAL LOW (ref 137–147)
Total Protein: 7 g/dL (ref 6.0–8.3)

## 2013-04-19 LAB — COMPREHENSIVE METABOLIC PANEL WITH GFR
AST: 29 U/L (ref 0–37)
BUN: 7 mg/dL (ref 6–23)
Potassium: 4 meq/L (ref 3.7–5.3)
Total Bilirubin: 0.3 mg/dL (ref 0.3–1.2)

## 2013-04-19 LAB — POC OCCULT BLOOD, ED: Fecal Occult Bld: POSITIVE — AB

## 2013-04-19 LAB — I-STAT CG4 LACTIC ACID, ED: Lactic Acid, Venous: 1.02 mmol/L (ref 0.5–2.2)

## 2013-04-19 MED ORDER — FENTANYL CITRATE 0.05 MG/ML IJ SOLN
50.0000 ug | Freq: Once | INTRAMUSCULAR | Status: AC
  Administered 2013-04-19: 50 ug via INTRAVENOUS
  Filled 2013-04-19: qty 2

## 2013-04-19 MED ORDER — LORAZEPAM 2 MG/ML IJ SOLN
0.5000 mg | Freq: Once | INTRAMUSCULAR | Status: AC
  Administered 2013-04-19: 0.5 mg via INTRAVENOUS
  Filled 2013-04-19: qty 1

## 2013-04-19 MED ORDER — SODIUM CHLORIDE 0.9 % IV SOLN
Freq: Once | INTRAVENOUS | Status: AC
  Administered 2013-04-19: 04:00:00 via INTRAVENOUS

## 2013-04-19 MED ORDER — IOHEXOL 300 MG/ML  SOLN
100.0000 mL | Freq: Once | INTRAMUSCULAR | Status: AC | PRN
  Administered 2013-04-19: 100 mL via INTRAVENOUS

## 2013-04-19 MED ORDER — IOHEXOL 300 MG/ML  SOLN
50.0000 mL | Freq: Once | INTRAMUSCULAR | Status: AC | PRN
  Administered 2013-04-19: 50 mL via ORAL
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
## 2013-04-19 NOTE — ED Notes (Signed)
Pt d/c from [facility] today after bladder reconstruction surgery followed by MI while in hospital with cardiac arrest, pt now c/o abd pain, constipation, dizziness. Pt very anxious and tearful in triage.

## 2013-04-19 NOTE — ED Notes (Addendum)
SonVania Rea:  [phone]          Katy: [phone]

## 2013-04-21 LAB — URINE CULTURE
Colony Count: >=100000
Special Requests: NORMAL

## 2013-04-22 NOTE — ED Notes (Signed)
+   Urine Start Keflex 500 mg TID x 7 days per [facility]

## 2013-04-22 NOTE — Progress Notes (Signed)
ED Antimicrobial Stewardship Positive Culture Follow Up   [name] is an 62 y.o. female who presented to [facility] on 04/19/2013 with a chief complaint of  Chief Complaint  Patient presents with  . Shortness of Breath  . Emesis    Recent Results (from the past 720 hour(s))  URINE CULTURE     Status: None   Collection Time    04/19/13  2:49 AM      Result Value Ref Range Status   Specimen Description URINE, CATHETERIZED   Final   Special Requests Normal   Final   Culture  Setup Time     Final   Value: 04/19/2013 09:09     Performed at [place]     Final   Value: >=100,000 COLONIES/ML     Performed at [facility]   Culture     Final   Value: KLEBSIELLA PNEUMONIAE     Performed at [facility]   Report Status 04/21/2013 FINAL   Final   Organism ID, Bacteria KLEBSIELLA PNEUMONIAE   Final    [x]  Patient discharged originally without antimicrobial agent and treatment is now indicated  Pt came in with SOB. Pt had recent bladder reconstruction so will treat for klebsiella that has grown in the urine culture.   New antibiotic prescription:   Start Keflex 500mg  PO TID x7 days  ED Provider: Clayton Bibles, PA   Onnie Boer, PharmD Pager: [phone] Infectious Diseases Pharmacist Phone# [phone]

## 2013-04-25 ENCOUNTER — Telehealth (HOSPITAL_BASED_OUTPATIENT_CLINIC_OR_DEPARTMENT_OTHER): Payer: Self-pay | Admitting: Emergency Medicine

## 2013-04-28 NOTE — Telephone Encounter (Signed)
Unable to contact patient via phone. Sent letter. °

## 2013-05-13 ENCOUNTER — Encounter: Payer: Self-pay | Admitting: Internal Medicine

## 2013-05-18 ENCOUNTER — Telehealth: Payer: Self-pay | Admitting: Internal Medicine

## 2013-05-18 NOTE — Telephone Encounter (Signed)
Do not need more medical records, per Caryl Comes.  Called [name] to inform her our scheduler would call her to set up OV with Dr. Caryl Comes. [name] is agreeable to plan.

## 2013-05-18 NOTE — Telephone Encounter (Signed)
New message ° ° ° ° °Returned a nurses call °

## 2013-05-18 NOTE — Telephone Encounter (Signed)
A user error has taken place: encounter opened in error, closed for administrative reasons   (See lexi myoview documentation)

## 2013-05-18 NOTE — Telephone Encounter (Signed)
New message    Patient calling stating she returning Dr. Caryl Comes nurse call back.

## 2013-05-18 NOTE — Telephone Encounter (Signed)
Reviewed lexi results with patient. She proceeds to inquire about recent medical records from [facility] being sent to [place] -- I explained that they sent d/c summary.  She tells me that she had DCCV & ablation while in hospital for bladder surgery. I will review with Dr. Caryl Comes (explained to patient that we will need records from that hospital stay) and let her know about coming by office to sign medical release form for the specific info from [facility].

## 2013-05-21 DIAGNOSIS — I4892 Unspecified atrial flutter: Secondary | ICD-10-CM | POA: Insufficient documentation

## 2013-05-26 ENCOUNTER — Telehealth: Payer: Self-pay | Admitting: Internal Medicine

## 2013-05-26 ENCOUNTER — Ambulatory Visit: Payer: 59 | Admitting: Internal Medicine

## 2013-05-26 NOTE — Telephone Encounter (Signed)
New message     1.Patient cancel  appointment  today at noon.    2. Would like Dr. Caryl Comes to call her for missed diagnosed at [facility] for her heart.  The diagnosed was Caught at [facility].  Would like for him to pull the records give an explanation as to why.

## 2013-05-26 NOTE — Telephone Encounter (Signed)
New message    Patient calling back to speak with nurse. Patient stated she has not heard anything from [facility] regarding previous message that was sent this am.

## 2013-05-26 NOTE — Telephone Encounter (Signed)
Reviewed with Dr. Caryl Comes, he is aware.

## 2013-05-27 ENCOUNTER — Other Ambulatory Visit: Payer: Self-pay | Admitting: *Deleted

## 2013-05-27 MED ORDER — CRANBERRY 400 MG PO CAPS: 400.0000 mg | ORAL_CAPSULE | Freq: Every day | ORAL | Status: DC

## 2013-05-27 MED ORDER — PRENATAL PLUS 27-1 MG PO TABS: 1.0000 | ORAL_TABLET | Freq: Every day | ORAL | Status: DC

## 2013-05-27 MED ORDER — EPINEPHRINE 0.15 MG/0.3ML IJ SOAJ: 0.1500 mg | INTRAMUSCULAR | Status: DC | PRN

## 2013-05-28 ENCOUNTER — Telehealth: Payer: Self-pay | Admitting: Physician Assistant

## 2013-05-28 MED ORDER — EPINEPHRINE 0.3 MG/0.3ML IJ SOAJ: 0.3000 mg | Freq: Once | INTRAMUSCULAR | Status: DC

## 2013-05-28 MED ORDER — CRANBERRY 500 MG PO CAPS: 500.0000 mg | ORAL_CAPSULE | Freq: Every day | ORAL | Status: DC

## 2013-05-28 NOTE — Telephone Encounter (Signed)
Advised pt that Dr. Caryl Comes will be more than happy to see her in office and pt is agreeable to this. I will send message to [facility] (scheduler) to call pt next week to schedule with Dr. Caryl Comes.  Patient verbalized understanding and agreeable to plan.

## 2013-05-28 NOTE — Telephone Encounter (Signed)
rx changed - erx done

## 2013-05-28 NOTE — Telephone Encounter (Signed)
Pt called stated that we call in Epipen for junior not for an adult ( this need to be change to adult) and [facility] only have Cranberry 500 mg (not 400mg ) Please advise. On what to do about these two med.

## 2013-05-31 ENCOUNTER — Telehealth: Payer: Self-pay | Admitting: *Deleted

## 2013-05-31 NOTE — Telephone Encounter (Signed)
Pt called requesting bone density and mammogram results.  Pt is a former pt of Stacy Gardner and results never sent to her.  please advise

## 2013-05-31 NOTE — Telephone Encounter (Signed)
Mammogram - needs additional studies, call [facility] - mild osteopenia, no meds needed at this time. Will recheck in 2 years.  TJ

## 2013-05-31 NOTE — Telephone Encounter (Signed)
Spoke with pt advised of MDs result note.

## 2013-06-03 ENCOUNTER — Telehealth: Payer: Self-pay | Admitting: Internal Medicine

## 2013-06-03 NOTE — Telephone Encounter (Signed)
New problem   Pt want to know how urgent in the appt cause she can't come at 12:00 b/c her son has to work. Please call pt.

## 2013-06-08 ENCOUNTER — Ambulatory Visit: Payer: 59 | Admitting: Internal Medicine

## 2013-06-10 ENCOUNTER — Telehealth: Payer: Self-pay | Admitting: Physician Assistant

## 2013-06-10 NOTE — Telephone Encounter (Signed)
Patient Information:  Caller Name: [name]  Phone: [phone]  Patient: [name], [name]  Gender: Female  DOB: [birth year]  Age: 63 Years  PCP: Other  Office Follow Up:  Does the office need to follow up with this patient?: Yes  Instructions For The Office: Please contact patient regarding Medical records and Medication issues  RN Note:  (1) questions about Hot flashes and estrace not working.  (2) did office receive her records from [place].  Symptoms  Reason For Call & Symptoms: Patient states she is was in [facility] for Bladder Reconstruction surgery/Vaginal mesh . April 09, 2013.   She states after surgery she experienced  Ventricular Tachycardia and  went into Cardiac Arrest and was ICU for 3 days,  She hospitalized total 3 weeks.  (1) she is wanting to know if the office received records  (2) She saw Urologist and surgeon yesterday, however, she reports and increase in her having  Hot flashes.  She reports since surgery increased in intensity.  They were perfectly controlled prior to surgery by her Estrace.  she states sweating, waking up wet, can feel the hot building afterwards chilled.   Urology referred back to her PCP.  She is currently on Estrace 1mg  daily.   New medications- ASA, metoprolol 25mg ,  Lomotil , oxycodone PRN.  Reviewed Health History In EMR: Yes  Reviewed Medications In EMR: Yes  Reviewed Allergies In EMR: Yes  Reviewed Surgeries / Procedures: Yes  Date of Onset of Symptoms: 06/10/2013  Treatments Tried: estrace  Treatments Tried Worked: No  Guideline(s) Used:  No Protocol Available - Sick Adult  Disposition Per Guideline:   Discuss with PCP and Callback by Nurse Today  Reason For Disposition Reached:   Nursing judgment  Advice Given:  Call Back If:  New symptoms develop  You become worse.  RN Overrode Recommendation:  Document Patient  Please contact patinet regarding Medical Records and Medication  issues.

## 2013-06-11 ENCOUNTER — Ambulatory Visit: Payer: 59 | Admitting: Internal Medicine

## 2013-06-11 NOTE — Telephone Encounter (Signed)
I have not seen any records She needs to establish with a new PCP

## 2013-06-11 NOTE — Telephone Encounter (Signed)
Spoke with pt advised of MDs message.  She is wanting someone to address her concern about th e hot flashes and Estradol.

## 2013-06-11 NOTE — Telephone Encounter (Signed)
She will have to be seen to address these symptoms

## 2013-06-11 NOTE — Telephone Encounter (Signed)
Pt scheduled appoint 

## 2013-06-15 ENCOUNTER — Ambulatory Visit: Payer: 59 | Admitting: Internal Medicine

## 2013-06-16 ENCOUNTER — Telehealth: Payer: Self-pay | Admitting: Internal Medicine

## 2013-06-16 ENCOUNTER — Encounter: Payer: Self-pay | Admitting: Internal Medicine

## 2013-06-16 ENCOUNTER — Other Ambulatory Visit: Payer: Self-pay | Admitting: Internal Medicine

## 2013-06-16 ENCOUNTER — Ambulatory Visit (INDEPENDENT_AMBULATORY_CARE_PROVIDER_SITE_OTHER): Payer: 59 | Admitting: Internal Medicine

## 2013-06-16 ENCOUNTER — Other Ambulatory Visit (INDEPENDENT_AMBULATORY_CARE_PROVIDER_SITE_OTHER): Payer: 59

## 2013-06-16 VITALS — BP 110/62 | HR 73 | Temp 98.3°F | Wt 160.2 lb

## 2013-06-16 DIAGNOSIS — N951 Menopausal and female climacteric states: Secondary | ICD-10-CM

## 2013-06-16 DIAGNOSIS — F419 Anxiety disorder, unspecified: Secondary | ICD-10-CM

## 2013-06-16 DIAGNOSIS — R232 Flushing: Secondary | ICD-10-CM | POA: Insufficient documentation

## 2013-06-16 DIAGNOSIS — M949 Disorder of cartilage, unspecified: Secondary | ICD-10-CM

## 2013-06-16 DIAGNOSIS — M899 Disorder of bone, unspecified: Secondary | ICD-10-CM

## 2013-06-16 DIAGNOSIS — F411 Generalized anxiety disorder: Secondary | ICD-10-CM

## 2013-06-16 DIAGNOSIS — M8580 Other specified disorders of bone density and structure, unspecified site: Secondary | ICD-10-CM

## 2013-06-16 LAB — URINALYSIS, ROUTINE W REFLEX MICROSCOPIC
Bilirubin Urine: NEGATIVE
Ketones, ur: NEGATIVE
NITRITE: POSITIVE — AB
PH: 6.5 (ref 5.0–8.0)
Specific Gravity, Urine: <=1.005 — AB (ref 1.000–1.030)
Total Protein, Urine: NEGATIVE
Urine Glucose: NEGATIVE
Urobilinogen, UA: 0.2 (ref 0.0–1.0)

## 2013-06-16 MED ORDER — ESTRADIOL 1 MG PO TABS: 1.0000 mg | ORAL_TABLET | Freq: Every day | ORAL | Status: DC

## 2013-06-16 MED ORDER — ALENDRONATE SODIUM 70 MG PO TABS: 70.0000 mg | ORAL_TABLET | ORAL | Status: DC

## 2013-06-16 MED ORDER — LORAZEPAM 0.5 MG PO TABS: 0.5000 mg | ORAL_TABLET | Freq: Two times a day (BID) | ORAL | Status: DC | PRN

## 2013-06-16 MED ORDER — CEPHALEXIN 500 MG PO CAPS: 500.0000 mg | ORAL_CAPSULE | Freq: Four times a day (QID) | ORAL | Status: DC

## 2013-06-16 NOTE — Patient Instructions (Addendum)
Your urine specimen will be sent for the testing to the lab  You will be contacted by phone if any changes need to be made immediately.  Otherwise, you will receive a letter about your results with an explanation, but please check with MyChart first.  Please remember to sign up for MyChart if you have not done so, as this will be important to you in the future with finding out test results, communicating by private email, and scheduling acute appointments online when needed.  Please take all new medication as prescribed - the ativan (lorazepam) as needed, and the fosamax (1 per wk for the bones)  OK to not take the prolia if you are taking the fosamax

## 2013-06-16 NOTE — Telephone Encounter (Signed)
FYI - see below  I decline to serve as this pt PCP  I spent over 30 min on numerous complaints, and declined at end visit to go over her mammogram results from mar 2015, and referred her to her PCP of record, Dr Ronnald Ramp  Pt was upset by this, was referred to Lebron Conners, office manager at pt request

## 2013-06-16 NOTE — Telephone Encounter (Signed)
Patient would like report/findings from mammogram done in March.

## 2013-06-16 NOTE — Progress Notes (Signed)
Pre visit review using our clinic review tool, if applicable. No additional management support is needed unless otherwise documented below in the visit note. 

## 2013-06-16 NOTE — Telephone Encounter (Signed)
Pt was given the written report at [facility] today, of which also states pt was given lay explanation after the actual exam as well.  I have nothing further to add, and am not responsible for the interpretation of the exam itself, nor the written record of the exam, or further management of results.   Pt should follow up with ordering MD of record - Dr Ronnald Ramp  I decline to serve as this pt PCP, though pt has requested this
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
## 2013-06-18 ENCOUNTER — Encounter: Payer: Self-pay | Admitting: Internal Medicine

## 2013-06-19 DIAGNOSIS — M8580 Other specified disorders of bone density and structure, unspecified site: Secondary | ICD-10-CM | POA: Insufficient documentation

## 2013-06-19 NOTE — Assessment & Plan Note (Signed)
Pt declines discussino regarding tx today

## 2013-06-19 NOTE — Assessment & Plan Note (Signed)
dxa reviewed with pt, for fosamax 70 qwk

## 2013-06-19 NOTE — Assessment & Plan Note (Addendum)
Etiology unclear. Afeb, to cont estradiol, thyroid meds as rx, for labs, UA, ativan prn anxiety  Lab Results  Component Value Date   TSH 1.26 02/24/2013

## 2013-06-24 ENCOUNTER — Encounter: Payer: Self-pay | Admitting: Internal Medicine

## 2013-06-24 ENCOUNTER — Ambulatory Visit (INDEPENDENT_AMBULATORY_CARE_PROVIDER_SITE_OTHER): Payer: 59 | Admitting: Internal Medicine

## 2013-06-24 VITALS — BP 96/61 | HR 67 | Ht 61.0 in | Wt 160.0 lb

## 2013-06-24 DIAGNOSIS — R079 Chest pain, unspecified: Secondary | ICD-10-CM

## 2013-06-24 NOTE — Patient Instructions (Addendum)
Your physician recommends that you continue on your current medications as directed. Please refer to the Current Medication list given to you today.  Your physician wants you to follow-up in: 6 months with Dr. Caryl Comes. You will receive a reminder letter in the mail two months in advance. If you don't receive a letter, please call our office to schedule the follow-up appointment.   Atrial Flutter Atrial flutter is a heart rhythm that can cause the heart to beat very fast (tachycardia). It originates in the upper chambers of the heart (atria). In atrial flutter, the top chambers of the heart (atria) often beat much faster than the bottom chambers of the heart (ventricles). Atrial flutter has a regular "saw toothed" appearance in an EKG readout. An EKG is a test that records the electrical activity of the heart. Atrial flutter can cause the heart to beat up to 150 beats per minute (BPM). Atrial flutter can either be short lived (paroxysmal) or permanent.  CAUSES  Causes of atrial flutter can be many. Some of these include:  Heart related issues:  Heart attack (myocardial infarction).  Heart failure.  Heart valve problems.  Poorly controlled high blood pressure (hypertension).  Afteropen heart surgery.  Lung related issues:  A blood clot in the lungs (pulmonary embolism).  Chronic obstructive pulmonary disease (COPD). Medications used to treat COPD can attribute to atrial flutter.  Other related causes:  Hyperthyroidism.  Caffeine.  Some decongestant cold medications.  Low electrolyte levels such as potassium or magnesium.  Cocaine. SYMPTOMS  An awareness of your heart beating rapidly (palpitations).  Shortness of breath.  Chest pain.  Low blood pressure (hypotension).  Dizziness or fainting. DIAGNOSIS  Different tests can be performed to diagnose atrial flutter.   An EKG.  Holter monitor. This is a 24 hour recording of your heart rhythm. You will also be given a  diary. Write down all symptoms that you have and what you were doing at the time you experienced symptoms.  Cardiac event monitor. This small device can be worn for up to 30 days. When you have heart symptoms, you will push a button on the device. This will then record your heart rhythm.  Echocardiogram. This is an imaging test to look at your heart. Your caregiver will look at your heart valves and the ventricles.  Stress Test. This test can help determine if the atrial flutter is related to exercise or if coronary artery disease is present.  Laboratory studies will look at certain blood levels like:  Complete blood count (CBC).  Potassium.  Magnesium.  Thyroid function. TREATMENT  Treatment of atrial flutter varies. A combination of therapies may be used or sometimes atrial flutter may need only 1 type of treatment.  Lab work: If your blood work, such as your electrolytes (potassium, magnesium) or your thyroid function tests are abnormal, your caregiver will treat them accordingly.  Medication:  There are several different types of medications that can convert your heart to a normal rhythm and prevent atrial flutter from reoccurring.  Nonsurgical procedures: Nonsurgical techniques may be used to control atrial flutter. Some examples include:  Cardioversion. This technique uses either drugs or an electrical shock to restore a normal heart rhythm:  Cardioversion drugs may be given through an intravenous (IV) line to help "reset" the heart rhythm.  In electrical cardioversion, your caregiver shocks your heart with electrical energy. This helps to reset the heartbeat to a normal rhythm.  Ablation. If atrial flutter is a persistent problem, an  ablation may be needed. This procedure is done under mild sedation. High frequency radio-wave energy is used to destroy the area of heart tissue responsible for atrial flutter. SEEK IMMEDIATE MEDICAL CARE IF:   Dizziness.  Near fainting or  fainting.  Shortness of breath.  Chest pain or pressure.  Sudden nausea or vomiting.  Profuse sweating. If you have the above symptoms, call your local emergency service immediately! Do not drive yourself to the hospital. MAKE SURE YOU:   Understand these instructions.  Will watch your condition.  Will get help right away if you are not doing well or get worse. Document Released: 05/26/2008 Document Revised: 04/01/2011 Document Reviewed: 05/26/2008 [facility] Patient Information 2014 [place], [place].   Atrial Fibrillation Atrial fibrillation is a type of irregular heart rhythm (arrhythmia). During atrial fibrillation, the upper chambers of the heart (atria) quiver continuously in a chaotic pattern. This causes an irregular and often rapid heart rate.  Atrial fibrillation is the result of the heart becoming overloaded with disorganized signals that tell it to beat. These signals are normally released one at a time by a part of the right atrium called the sinoatrial node. They then travel from the atria to the lower chambers of the heart (ventricles), causing the atria and ventricles to contract and pump blood as they pass. In atrial fibrillation, parts of the atria outside of the sinoatrial node also release these signals. This results in two problems. First, the atria receive so many signals that they do not have time to fully contract. Second, the ventricles, which can only receive one signal at a time, beat irregularly and out of rhythm with the atria.  There are three types of atrial fibrillation:   Paroxysmal Paroxysmal atrial fibrillation starts suddenly and stops on its own within a week.   Persistent Persistent atrial fibrillation lasts for more than a week. It may stop on its own or with treatment.   Permanent Permanent atrial fibrillation does not go away. Episodes of atrial fibrillation may lead to permanent atrial fibrillation.  Atrial fibrillation can prevent your heart  from pumping blood normally. It increases your risk of stroke and can lead to heart failure.  CAUSES   Heart conditions, including a heart attack, heart failure, coronary artery disease, and heart valve conditions.   Inflammation of the sac that surrounds the heart (pericarditis).   Blockage of an artery in the lungs (pulmonary embolism).   Pneumonia or other infections.   Chronic lung disease.   Thyroid problems, especially if the thyroid is overactive (hyperthyroidism).   Caffeine, excessive alcohol use, and use of some illegal drugs.   Use of some medications, including certain decongestants and diet pills.   Heart surgery.   Birth defects.  Sometimes, no cause can be found. When this happens, the atrial fibrillation is called lone atrial fibrillation. The risk of complications from atrial fibrillation increases if you have lone atrial fibrillation and you are age 59 years or older. RISK FACTORS  Heart failure.  Coronary artery disease  Diabetes mellitus.   High blood pressure (hypertension).   Obesity.   Other arrhythmias.   Increased age. SYMPTOMS   A feeling that your heart is beating rapidly or irregularly.   A feeling of discomfort or pain in your chest.   Shortness of breath.   Sudden lightheadedness or weakness.   Getting tired easily when exercising.   Urinating more often than normal (mainly when atrial fibrillation first begins).  In paroxysmal atrial fibrillation, symptoms may start  and suddenly stop. DIAGNOSIS  Your caregiver may be able to detect atrial fibrillation when taking your pulse. Usually, testing is needed to diagnosis atrial fibrillation. Tests may include:   Electrocardiography. During this test, the electrical impulses of your heart are recorded while you are lying down.   Echocardiography. During echocardiography, sound waves are used to evaluate how blood flows through your heart.   Stress test. There is  more than one type of stress test. If a stress test is needed, ask your caregiver about which type is best for you.   Chest X-ray exam.   Blood tests.   Computed tomography (CT).  TREATMENT   Treating any underlying conditions. For example, if you have an overactive thyroid, treating the condition may correct atrial fibrillation.   Medication. Medications may be given to control a rapid heart rate or to prevent blood clots, heart failure, or a stroke.   Procedure to correct the rhythm of the heart:  Electrical cardioversion. During electrical cardioversion, a controlled, low-energy shock is delivered to the heart through your skin. If you have chest pain, very low pressure blood pressure, or sudden heart failure, this procedure may need to be done as an emergency.  Catheter ablation. During this procedure, heart tissues that send the signals that cause atrial fibrillation are destroyed.  Maze or minimaze procedure. During this surgery, thin lines of heart tissue that carry the abnormal signals are destroyed. The maze procedure is an open-heart surgery. The minimaze procedure is a minimally invasive surgery. This means that small cuts are made to access the heart instead of a large opening.  Pulmonary venous isolation. During this surgery, tissue around the veins that carry blood from the lungs (pulmonary veins) is destroyed. This tissue is thought to carry the abnormal signals. HOME CARE INSTRUCTIONS   Take medications as directed by your caregiver.  Only take medications that your caregiver approves. Some medications can make atrial fibrillation worse or recur.  If blood thinners were prescribed by your caregiver, take them exactly as directed. Too much can cause bleeding. Too little and you will not have the needed protection against stroke and other problems.  Perform blood tests at home if directed by your caregiver.  Perform blood tests exactly as directed.   Quit  smoking if you smoke.   Do not drink alcohol.   Do not drink caffeinated beverages such as coffee, soda, and some teas. You may drink decaffeinated coffee, soda, or tea.   Maintain a healthy weight. Do not use diet pills unless your caregiver approves. They may make heart problems worse.   Follow diet instructions as directed by your caregiver.   Exercise regularly as directed by your caregiver.   Keep all follow-up appointments. PREVENTION  The following substances can cause atrial fibrillation to recur:   Caffeinated beverages.   Alcohol.   Certain medications, especially those used for breathing problems.   Certain herbs and herbal medications, such as those containing ephedra or ginseng.  Illegal drugs such as cocaine and amphetamines. Sometimes medications are given to prevent atrial fibrillation from recurring. Proper treatment of any underlying condition is also important in helping prevent recurrence.  SEEK MEDICAL CARE IF:  You notice a change in the rate, rhythm, or strength of your heartbeat.   You suddenly begin urinating more frequently.   You tire more easily when exerting yourself or exercising.  SEEK IMMEDIATE MEDICAL CARE IF:   You develop chest pain, abdominal pain, sweating, or weakness.  You feel  sick to your stomach (nauseous).  You develop shortness of breath.  You suddenly develop swollen feet and ankles.  You feel dizzy.  You face or limbs feel numb or weak.  There is a change in your vision or speech. MAKE SURE YOU:   Understand these instructions.  Will watch your condition.  Will get help right away if you are not doing well or get worse. Document Released: 01/07/2005 Document Revised: 05/04/2012 Document Reviewed: 02/18/2012 [facility] Patient Information 2014 [place].
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
## 2013-07-05 ENCOUNTER — Ambulatory Visit: Payer: 59 | Admitting: Internal Medicine

## 2013-07-07 ENCOUNTER — Ambulatory Visit (INDEPENDENT_AMBULATORY_CARE_PROVIDER_SITE_OTHER): Payer: 59 | Admitting: Emergency Medicine

## 2013-07-07 ENCOUNTER — Ambulatory Visit (INDEPENDENT_AMBULATORY_CARE_PROVIDER_SITE_OTHER): Payer: 59

## 2013-07-07 VITALS — BP 108/62 | HR 75 | Temp 98.2°F | Resp 16 | Ht 61.0 in | Wt 160.0 lb

## 2013-07-07 DIAGNOSIS — M549 Dorsalgia, unspecified: Secondary | ICD-10-CM

## 2013-07-07 DIAGNOSIS — S139XXA Sprain of joints and ligaments of unspecified parts of neck, initial encounter: Secondary | ICD-10-CM

## 2013-07-07 MED ORDER — CYCLOBENZAPRINE HCL 10 MG PO TABS: 10.0000 mg | ORAL_TABLET | Freq: Three times a day (TID) | ORAL | Status: DC | PRN

## 2013-07-07 MED ORDER — NAPROXEN SODIUM 550 MG PO TABS: 550.0000 mg | ORAL_TABLET | Freq: Two times a day (BID) | ORAL | Status: DC

## 2013-07-07 NOTE — Patient Instructions (Signed)
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
## 2013-07-08 ENCOUNTER — Ambulatory Visit: Payer: 59 | Admitting: Internal Medicine

## 2013-07-13 ENCOUNTER — Encounter: Payer: Self-pay | Admitting: Family Medicine

## 2013-07-13 ENCOUNTER — Ambulatory Visit (INDEPENDENT_AMBULATORY_CARE_PROVIDER_SITE_OTHER): Payer: 59 | Admitting: Family Medicine

## 2013-07-13 VITALS — BP 106/64 | HR 69 | Ht 61.0 in | Wt 162.0 lb

## 2013-07-13 DIAGNOSIS — G8929 Other chronic pain: Secondary | ICD-10-CM | POA: Insufficient documentation

## 2013-07-13 DIAGNOSIS — M2141 Flat foot [pes planus] (acquired), right foot: Secondary | ICD-10-CM

## 2013-07-13 DIAGNOSIS — M549 Dorsalgia, unspecified: Secondary | ICD-10-CM

## 2013-07-13 DIAGNOSIS — M2140 Flat foot [pes planus] (acquired), unspecified foot: Secondary | ICD-10-CM

## 2013-07-13 DIAGNOSIS — M2142 Flat foot [pes planus] (acquired), left foot: Secondary | ICD-10-CM

## 2013-07-13 MED ORDER — ACETAMINOPHEN ER 650 MG PO TBCR: 650.0000 mg | EXTENDED_RELEASE_TABLET | Freq: Three times a day (TID) | ORAL | Status: DC

## 2013-07-13 MED ORDER — KETOROLAC TROMETHAMINE 60 MG/2ML IM SOLN
60.0000 mg | Freq: Once | INTRAMUSCULAR | Status: AC
Start: 2013-07-13 — End: 2013-07-13
  Administered 2013-07-13: 60 mg via INTRAMUSCULAR

## 2013-07-13 MED ORDER — UNABLE TO FIND: Status: DC

## 2013-07-13 MED ORDER — VITAMIN D 50 MCG (2000 UT) PO TABS: 2000.0000 [IU] | ORAL_TABLET | Freq: Every day | ORAL | Status: DC

## 2013-07-13 MED ORDER — METHYLPREDNISOLONE ACETATE 80 MG/ML IJ SUSP
80.0000 mg | Freq: Once | INTRAMUSCULAR | Status: AC
  Administered 2013-07-13: 80 mg via INTRAMUSCULAR

## 2013-07-13 NOTE — Assessment & Plan Note (Signed)
Patient does have severe pes planus bilaterally that I think does cause some malalignment of the lower spine. Patient was given a home exercise program and will try some over-the-counter orthotics. I think patient is going to need custom orthotics which will be beneficial. We'll discuss this further at followup.

## 2013-07-13 NOTE — Patient Instructions (Signed)
Very nice to meet you Exercises 3 times a week.  Take tylenol 650 mg three times a day is the best evidence based medicine we have for arthritis.  Glucosamine sulfate 750mg  twice a day is a supplement that has been shown to help moderate to severe arthritis. Vitamin D 2000 IU daily Fish oil 2 grams daily.  Tumeric 500mg  twice daily.  Capsaicin topically up to four times a day may also help with pain.  It's important that you continue to stay active. Controlling your weight is important.  Consider physical therapy to strengthen muscles around the joint that hurts to take pressure off of the joint itself.  Shoe inserts with good arch support may be helpful.  Spenco orthotics at [employer] could help.  Bronwen Betters, Dankso and New Balance greater then 700 or above.  Water aerobics and cycling with low resistance are the best two types of exercise for arthritis. Come back and see me in 3 weeks.  Make a 30 minute appoint to make orthotics.
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
## 2013-07-13 NOTE — Assessment & Plan Note (Signed)
Patient's chronic back pain seems to be multifactorial. Patient's MRI does not show any nerve root impingement and does show some mild to moderate osteoarthritic changes. Patient's does have poor course strength testing could also be contributing. Patient was given home exercise program, 2 injections today to decrease any inflammation, discussed icing protocol. We discussed proper lifting techniques the can be beneficial. Patient also has significant flat feet that could be contributing. Patient likely is going to need custom orthotics but will try over-the-counter orthotics first. We'll have patient try this and come back in 3-4 weeks for further evaluation and treatment.

## 2013-07-15 ENCOUNTER — Telehealth (HOSPITAL_BASED_OUTPATIENT_CLINIC_OR_DEPARTMENT_OTHER): Payer: Self-pay | Admitting: Emergency Medicine

## 2013-07-19 ENCOUNTER — Other Ambulatory Visit: Payer: Self-pay | Admitting: *Deleted

## 2013-07-19 MED ORDER — CALTRATE 600+D PLUS MINERALS 600-800 MG-UNIT PO CHEW: CHEWABLE_TABLET | ORAL | Status: DC

## 2013-07-19 NOTE — Telephone Encounter (Signed)
Left msg on triage wanting prescription sent in on her calcium to [facility]...Johny Chess

## 2013-07-22 ENCOUNTER — Encounter: Payer: Self-pay | Admitting: Internal Medicine

## 2013-07-22 ENCOUNTER — Other Ambulatory Visit (INDEPENDENT_AMBULATORY_CARE_PROVIDER_SITE_OTHER): Payer: 59

## 2013-07-22 ENCOUNTER — Ambulatory Visit (INDEPENDENT_AMBULATORY_CARE_PROVIDER_SITE_OTHER): Payer: 59 | Admitting: Internal Medicine

## 2013-07-22 ENCOUNTER — Telehealth: Payer: Self-pay | Admitting: Internal Medicine

## 2013-07-22 VITALS — BP 100/62 | HR 84 | Temp 98.8°F | Resp 16 | Ht 61.0 in | Wt 160.0 lb

## 2013-07-22 DIAGNOSIS — N951 Menopausal and female climacteric states: Secondary | ICD-10-CM

## 2013-07-22 DIAGNOSIS — D72829 Elevated white blood cell count, unspecified: Secondary | ICD-10-CM

## 2013-07-22 DIAGNOSIS — R7309 Other abnormal glucose: Secondary | ICD-10-CM

## 2013-07-22 DIAGNOSIS — Z23 Encounter for immunization: Secondary | ICD-10-CM

## 2013-07-22 DIAGNOSIS — E039 Hypothyroidism, unspecified: Secondary | ICD-10-CM

## 2013-07-22 DIAGNOSIS — I4892 Unspecified atrial flutter: Secondary | ICD-10-CM

## 2013-07-22 DIAGNOSIS — E785 Hyperlipidemia, unspecified: Secondary | ICD-10-CM

## 2013-07-22 DIAGNOSIS — R232 Flushing: Secondary | ICD-10-CM

## 2013-07-22 LAB — COMPREHENSIVE METABOLIC PANEL
ALBUMIN: 3.9 g/dL (ref 3.5–5.2)
ALT: 18 U/L (ref 0–35)
AST: 22 U/L (ref 0–37)
Alkaline Phosphatase: 73 U/L (ref 39–117)
BUN: 13 mg/dL (ref 6–23)
CHLORIDE: 104 meq/L (ref 96–112)
CO2: 27 mEq/L (ref 19–32)
CREATININE: 0.7 mg/dL (ref 0.4–1.2)
Calcium: 9.3 mg/dL (ref 8.4–10.5)
GFR: 91.58 mL/min (ref >60.00)
Glucose, Bld: 97 mg/dL (ref 70–99)
Potassium: 4.2 mEq/L (ref 3.5–5.1)
Sodium: 136 mEq/L (ref 135–145)
Total Bilirubin: 0.4 mg/dL (ref 0.2–1.2)
Total Protein: 7.7 g/dL (ref 6.0–8.3)

## 2013-07-22 LAB — CBC WITH DIFFERENTIAL/PLATELET
Basophils Absolute: 0 10*3/uL (ref 0.0–0.1)
Basophils Relative: 0.4 % (ref 0.0–3.0)
EOS ABS: 0.1 10*3/uL (ref 0.0–0.7)
Eosinophils Relative: 2.2 % (ref 0.0–5.0)
HCT: 39.4 % (ref 36.0–46.0)
HEMOGLOBIN: 13.5 g/dL (ref 12.0–15.0)
Lymphocytes Relative: 29.9 % (ref 12.0–46.0)
Lymphs Abs: 1.6 10*3/uL (ref 0.7–4.0)
MCHC: 34.2 g/dL (ref 30.0–36.0)
MCV: 85 fl (ref 78.0–100.0)
MONO ABS: 0.5 10*3/uL (ref 0.1–1.0)
Monocytes Relative: 9.1 % (ref 3.0–12.0)
NEUTROS ABS: 3.2 10*3/uL (ref 1.4–7.7)
Neutrophils Relative %: 58.4 % (ref 43.0–77.0)
Platelets: 289 10*3/uL (ref 150.0–400.0)
RBC: 4.64 Mil/uL (ref 3.87–5.11)
RDW: 13.5 % (ref 11.5–15.5)
WBC: 5.5 10*3/uL (ref 4.0–10.5)

## 2013-07-22 LAB — HEMOGLOBIN A1C: HEMOGLOBIN A1C: 5.6 % (ref 4.6–6.5)

## 2013-07-22 LAB — LUTEINIZING HORMONE: LH: 32.28 m[IU]/mL

## 2013-07-22 LAB — SEDIMENTATION RATE: SED RATE: 31 mm/h — AB (ref 0–22)

## 2013-07-22 LAB — TSH: TSH: 0.66 u[IU]/mL (ref 0.35–4.50)

## 2013-07-22 LAB — FOLLICLE STIMULATING HORMONE: FSH: 71.6 m[IU]/mL

## 2013-07-22 NOTE — Telephone Encounter (Signed)
Patient has questions regarding medication. Please call and advise.

## 2013-07-22 NOTE — Progress Notes (Signed)
Pre visit review using our clinic review tool, if applicable. No additional management support is needed unless otherwise documented below in the visit note. 

## 2013-07-22 NOTE — Progress Notes (Signed)
Subjective:    Patient ID: [name], female    DOB: 13-Apr-1950, 63 y.o.   MRN: [id number]  Thyroid Problem Presents for follow-up visit. Symptoms include diaphoresis (she complains of hot flashes) and hair loss. Patient reports no anxiety, cold intolerance, constipation, depressed mood, diarrhea, dry skin, fatigue, heat intolerance, hoarse voice, leg swelling, menstrual problem, nail problem, palpitations, tremors, visual change, weight gain or weight loss. The symptoms have been stable. Past treatments include levothyroxine. The treatment provided moderate relief.      Review of Systems  Constitutional: Positive for diaphoresis (she complains of hot flashes). Negative for fever, chills, weight loss, weight gain, appetite change and fatigue.  HENT: Negative.  Negative for hoarse voice.   Eyes: Negative.   Respiratory: Negative.  Negative for cough, choking, chest tightness, shortness of breath and stridor.   Cardiovascular: Negative.  Negative for chest pain, palpitations and leg swelling.  Gastrointestinal: Negative.  Negative for nausea, vomiting, abdominal pain, diarrhea, constipation and blood in stool.  Endocrine: Negative.  Negative for cold intolerance and heat intolerance.  Genitourinary: Negative.  Negative for menstrual problem.  Musculoskeletal: Positive for back pain and neck pain. Negative for arthralgias and myalgias.       She has neck and back pain that is being treated by Dr. Alfonso Ramus  Skin: Negative.  Negative for rash.  Allergic/Immunologic: Negative.   Neurological: Negative.  Negative for dizziness, tremors, weakness, light-headedness, numbness and headaches.  Hematological: Negative.  Negative for adenopathy. Does not bruise/bleed easily.  Psychiatric/Behavioral: Negative.        Objective:   Physical Exam  Vitals reviewed. Constitutional: She is oriented to person, place, and time. She appears well-developed and well-nourished. No distress.  HENT:  Head:  Normocephalic and atraumatic.  Mouth/Throat: Oropharynx is clear and moist. No oropharyngeal exudate.  Eyes: Conjunctivae are normal. Right eye exhibits no discharge. Left eye exhibits no discharge. No scleral icterus.  Neck: Normal range of motion. Neck supple. No JVD present. No tracheal deviation present. No thyromegaly present.  Cardiovascular: Normal rate, regular rhythm, normal heart sounds and intact distal pulses.  Exam reveals no gallop and no friction rub.   No murmur heard. Pulmonary/Chest: Effort normal and breath sounds normal. No stridor. No respiratory distress. She has no wheezes. She has no rales. She exhibits no tenderness.  Abdominal: Soft. Bowel sounds are normal. She exhibits no distension and no mass. There is no tenderness. There is no rebound and no guarding.  Musculoskeletal: Normal range of motion. She exhibits no edema and no tenderness.  Lymphadenopathy:    She has no cervical adenopathy.  Neurological: She is oriented to person, place, and time.  Skin: Skin is warm and dry. No rash noted. She is not diaphoretic. No erythema. No pallor.  Psychiatric: She has a normal mood and affect. Her behavior is normal. Judgment and thought content normal.    Lab Results  Component Value Date   WBC 11.2* 04/19/2013   HGB 13.2 04/19/2013   HCT 38.7 04/19/2013   PLT 497* 04/19/2013   GLUCOSE 118* 04/19/2013   CHOL 187 02/24/2013   TRIG 158.0* 02/24/2013   HDL 54.00 02/24/2013   LDLDIRECT 96.6 02/25/2008   LDLCALC 101* 02/24/2013   ALT 39* 04/19/2013   AST 29 04/19/2013   NA 134* 04/19/2013   K 4.0 04/19/2013   CL 95* 04/19/2013   CREATININE 0.59 04/19/2013   BUN 7 04/19/2013   CO2 26 04/19/2013   TSH 1.26 02/24/2013   INR  0.99 07/02/2012        Assessment & Plan:

## 2013-07-22 NOTE — Patient Instructions (Signed)
Hypothyroidism The thyroid is a large gland located in the lower front of your neck. The thyroid gland helps control metabolism. Metabolism is how your body handles food. It controls metabolism with the hormone thyroxine. When this gland is underactive (hypothyroid), it produces too little hormone.  CAUSES These include:   Absence or destruction of thyroid tissue.  Goiter due to iodine deficiency.  Goiter due to medications.  Congenital defects (since birth).  Problems with the pituitary. This causes a lack of TSH (thyroid stimulating hormone). This hormone tells the thyroid to turn out more hormone. SYMPTOMS  Lethargy (feeling as though you have no energy)  Cold intolerance  Weight gain (in spite of normal food intake)  Dry skin  Coarse hair  Menstrual irregularity (if severe, may lead to infertility)  Slowing of thought processes Cardiac problems are also caused by insufficient amounts of thyroid hormone. Hypothyroidism in the newborn is cretinism, and is an extreme form. It is important that this form be treated adequately and immediately or it will lead rapidly to retarded physical and mental development. DIAGNOSIS  To prove hypothyroidism, your caregiver may do blood tests and ultrasound tests. Sometimes the signs are hidden. It may be necessary for your caregiver to watch this illness with blood tests either before or after diagnosis and treatment. TREATMENT  Low levels of thyroid hormone are increased by using synthetic thyroid hormone. This is a safe, effective treatment. It usually takes about four weeks to gain the full effects of the medication. After you have the full effect of the medication, it will generally take another four weeks for problems to leave. Your caregiver may start you on low doses. If you have had heart problems the dose may be gradually increased. It is generally not an emergency to get rapidly to normal. HOME CARE INSTRUCTIONS   Take your  medications as your caregiver suggests. Let your caregiver know of any medications you are taking or start taking. Your caregiver will help you with dosage schedules.  As your condition improves, your dosage needs may increase. It will be necessary to have continuing blood tests as suggested by your caregiver.  Report all suspected medication side effects to your caregiver. SEEK MEDICAL CARE IF: Seek medical care if you develop:  Sweating.  Tremulousness (tremors).  Anxiety.  Rapid weight loss.  Heat intolerance.  Emotional swings.  Diarrhea.  Weakness. SEEK IMMEDIATE MEDICAL CARE IF:  You develop chest pain, an irregular heart beat (palpitations), or a rapid heart beat. MAKE SURE YOU:   Understand these instructions.  Will watch your condition.  Will get help right away if you are not doing well or get worse. Document Released: 01/07/2005 Document Revised: 04/01/2011 Document Reviewed: 08/28/2007 Exit[employer] Patient Information 2015 [employer]. This information is not intended to replace advice given to you by your health care provider. Make sure you discuss any questions you have with your health care provider.  

## 2013-07-24 ENCOUNTER — Encounter: Payer: Self-pay | Admitting: Internal Medicine

## 2013-07-24 NOTE — Assessment & Plan Note (Signed)
She has good rate and rhythm control today 

## 2013-07-24 NOTE — Assessment & Plan Note (Signed)
Her LH and FSH are c/w postmenopause and this is being treated

## 2013-07-24 NOTE — Assessment & Plan Note (Signed)
She has pre-diabetes and will work on her lifestyle modifications 

## 2013-07-24 NOTE — Assessment & Plan Note (Signed)
Her CBC is normal now This was a transient issue

## 2013-07-27 LAB — SPEP & IFE WITH QIG
ALBUMIN ELP: 56.9 % (ref 55.8–66.1)
ALPHA-1-GLOBULIN: 3.4 % (ref 2.9–4.9)
Alpha-2-Globulin: 9 % (ref 7.1–11.8)
BETA 2: 5.7 % (ref 3.2–6.5)
BETA GLOBULIN: 7.4 % — AB (ref 4.7–7.2)
GAMMA GLOBULIN: 17.6 % (ref 11.1–18.8)
IgA: 504 mg/dL — ABNORMAL HIGH (ref 69–380)
IgG (Immunoglobin G), Serum: 1240 mg/dL (ref 690–1700)
IgM, Serum: 115 mg/dL (ref 52–322)
Total Protein, Serum Electrophoresis: 7.3 g/dL (ref 6.0–8.3)

## 2013-07-27 NOTE — Telephone Encounter (Signed)
Have left several messages attempting to reach patient

## 2013-07-27 NOTE — Telephone Encounter (Addendum)
Patient returned phone call explaining that she has attempted several times to reach/speak with physician at [facility] (Dr. Rosebud Poles) and been unsuccessful. She is currently not on any heart medications and this concerns her.  She stopped Metoprolol secondary to hair loss and severe hot flashes.  She would like to know what Dr. Caryl Comes recommends. Will review with him and call her back, she is agreeable to this.

## 2013-07-29 NOTE — Telephone Encounter (Signed)
Advised patient ok to be off medications, per Dr. Caryl Comes. Try to reach [facility], so that you may discuss this with them. Pt is agreeable.

## 2013-07-30 ENCOUNTER — Ambulatory Visit (INDEPENDENT_AMBULATORY_CARE_PROVIDER_SITE_OTHER): Payer: 59 | Admitting: Internal Medicine

## 2013-07-30 ENCOUNTER — Encounter: Payer: Self-pay | Admitting: Internal Medicine

## 2013-07-30 VITALS — BP 110/66 | HR 71 | Temp 98.8°F | Resp 16 | Ht 61.0 in | Wt 161.4 lb

## 2013-07-30 DIAGNOSIS — E039 Hypothyroidism, unspecified: Secondary | ICD-10-CM

## 2013-07-30 DIAGNOSIS — I4892 Unspecified atrial flutter: Secondary | ICD-10-CM

## 2013-07-30 NOTE — Progress Notes (Signed)
Pre visit review using our clinic review tool, if applicable. No additional management support is needed unless otherwise documented below in the visit note. 

## 2013-07-30 NOTE — Patient Instructions (Signed)
Hypothyroidism The thyroid is a large gland located in the lower front of your neck. The thyroid gland helps control metabolism. Metabolism is how your body handles food. It controls metabolism with the hormone thyroxine. When this gland is underactive (hypothyroid), it produces too little hormone.  CAUSES These include:   Absence or destruction of thyroid tissue.  Goiter due to iodine deficiency.  Goiter due to medications.  Congenital defects (since birth).  Problems with the pituitary. This causes a lack of TSH (thyroid stimulating hormone). This hormone tells the thyroid to turn out more hormone. SYMPTOMS  Lethargy (feeling as though you have no energy)  Cold intolerance  Weight gain (in spite of normal food intake)  Dry skin  Coarse hair  Menstrual irregularity (if severe, may lead to infertility)  Slowing of thought processes Cardiac problems are also caused by insufficient amounts of thyroid hormone. Hypothyroidism in the newborn is cretinism, and is an extreme form. It is important that this form be treated adequately and immediately or it will lead rapidly to retarded physical and mental development. DIAGNOSIS  To prove hypothyroidism, your caregiver may do blood tests and ultrasound tests. Sometimes the signs are hidden. It may be necessary for your caregiver to watch this illness with blood tests either before or after diagnosis and treatment. TREATMENT  Low levels of thyroid hormone are increased by using synthetic thyroid hormone. This is a safe, effective treatment. It usually takes about four weeks to gain the full effects of the medication. After you have the full effect of the medication, it will generally take another four weeks for problems to leave. Your caregiver may start you on low doses. If you have had heart problems the dose may be gradually increased. It is generally not an emergency to get rapidly to normal. HOME CARE INSTRUCTIONS   Take your  medications as your caregiver suggests. Let your caregiver know of any medications you are taking or start taking. Your caregiver will help you with dosage schedules.  As your condition improves, your dosage needs may increase. It will be necessary to have continuing blood tests as suggested by your caregiver.  Report all suspected medication side effects to your caregiver. SEEK MEDICAL CARE IF: Seek medical care if you develop:  Sweating.  Tremulousness (tremors).  Anxiety.  Rapid weight loss.  Heat intolerance.  Emotional swings.  Diarrhea.  Weakness. SEEK IMMEDIATE MEDICAL CARE IF:  You develop chest pain, an irregular heart beat (palpitations), or a rapid heart beat. MAKE SURE YOU:   Understand these instructions.  Will watch your condition.  Will get help right away if you are not doing well or get worse. Document Released: 01/07/2005 Document Revised: 04/01/2011 Document Reviewed: 08/28/2007 Exit[employer] Patient Information 2015 [employer]. This information is not intended to replace advice given to you by your health care provider. Make sure you discuss any questions you have with your health care provider.  

## 2013-07-30 NOTE — Progress Notes (Signed)
   Subjective:    Patient ID: [name], female    DOB: December 21, 1950, 63 y.o.   MRN: [id number]  Hypertension This is a chronic problem. The current episode started more than 1 year ago. The problem is unchanged. The problem is controlled. Pertinent negatives include no anxiety, blurred vision, chest pain, headaches, malaise/fatigue, neck pain, orthopnea, palpitations, peripheral edema, PND, shortness of breath or sweats. Past treatments include beta blockers. The current treatment provides significant improvement. Compliance problems include psychosocial issues.       Review of Systems  Constitutional: Negative.  Negative for fever, chills, malaise/fatigue, diaphoresis, appetite change and fatigue.  HENT: Negative.   Eyes: Negative.  Negative for blurred vision.  Respiratory: Negative.  Negative for cough, choking, chest tightness, shortness of breath, wheezing and stridor.   Cardiovascular: Negative.  Negative for chest pain, palpitations, orthopnea and PND.  Gastrointestinal: Negative.  Negative for nausea, vomiting, abdominal pain, diarrhea, constipation and blood in stool.  Endocrine: Negative.   Genitourinary: Negative.   Musculoskeletal: Negative.  Negative for arthralgias, back pain, myalgias and neck pain.  Skin: Negative.  Negative for rash.  Allergic/Immunologic: Negative.   Neurological: Negative.  Negative for dizziness, syncope, speech difficulty, light-headedness and headaches.  Hematological: Negative.  Negative for adenopathy. Does not bruise/bleed easily.  Psychiatric/Behavioral: Negative.        Objective:   Physical Exam  Vitals reviewed. Constitutional: She is oriented to person, place, and time. She appears well-developed and well-nourished. No distress.  HENT:  Head: Normocephalic and atraumatic.  Mouth/Throat: Oropharynx is clear and moist. No oropharyngeal exudate.  Eyes: Conjunctivae are normal. Right eye exhibits no discharge. Left eye exhibits no  discharge. No scleral icterus.  Neck: Normal range of motion. Neck supple. No JVD present. No tracheal deviation present. No thyromegaly present.  Cardiovascular: Normal rate, regular rhythm, normal heart sounds and intact distal pulses.  Exam reveals no gallop and no friction rub.   No murmur heard. Pulmonary/Chest: Effort normal and breath sounds normal. No stridor. No respiratory distress. She has no wheezes. She has no rales. She exhibits no tenderness.  Abdominal: Soft. Bowel sounds are normal. She exhibits no distension and no mass. There is no tenderness. There is no rebound and no guarding.  Musculoskeletal: Normal range of motion. She exhibits no edema and no tenderness.  Lymphadenopathy:    She has no cervical adenopathy.  Neurological: She is oriented to person, place, and time.  Skin: Skin is warm and dry. No rash noted. She is not diaphoretic. No erythema. No pallor.  Psychiatric: She has a normal mood and affect. Her behavior is normal. Judgment and thought content normal.     Lab Results  Component Value Date   WBC 5.5 07/22/2013   HGB 13.5 07/22/2013   HCT 39.4 07/22/2013   PLT 289.0 07/22/2013   GLUCOSE 97 07/22/2013   CHOL 187 02/24/2013   TRIG 158.0* 02/24/2013   HDL 54.00 02/24/2013   LDLDIRECT 96.6 02/25/2008   LDLCALC 101* 02/24/2013   ALT 18 07/22/2013   AST 22 07/22/2013   NA 136 07/22/2013   K 4.2 07/22/2013   CL 104 07/22/2013   CREATININE 0.7 07/22/2013   BUN 13 07/22/2013   CO2 27 07/22/2013   TSH 0.66 07/22/2013   INR 0.99 07/02/2012   HGBA1C 5.6 07/22/2013       Assessment & Plan:

## 2013-07-31 NOTE — Assessment & Plan Note (Signed)
Her recent TSH was in the normal range so she will stay on the current dose

## 2013-07-31 NOTE — Assessment & Plan Note (Signed)
Her EKG shows NSR and she has not had any s/s of recurrent A flutter She has decided to stop taking the BB so I have asked her to see her cardiologist at [facility] for further evaluation

## 2013-08-05 ENCOUNTER — Ambulatory Visit (INDEPENDENT_AMBULATORY_CARE_PROVIDER_SITE_OTHER): Payer: 59 | Admitting: Family Medicine

## 2013-08-05 ENCOUNTER — Encounter: Payer: Self-pay | Admitting: Family Medicine

## 2013-08-05 VITALS — BP 110/70 | HR 72 | Temp 98.7°F | Ht 61.0 in | Wt 159.5 lb

## 2013-08-05 DIAGNOSIS — M2142 Flat foot [pes planus] (acquired), left foot: Secondary | ICD-10-CM

## 2013-08-05 DIAGNOSIS — G8929 Other chronic pain: Secondary | ICD-10-CM

## 2013-08-05 DIAGNOSIS — M2140 Flat foot [pes planus] (acquired), unspecified foot: Secondary | ICD-10-CM

## 2013-08-05 DIAGNOSIS — M549 Dorsalgia, unspecified: Secondary | ICD-10-CM

## 2013-08-05 DIAGNOSIS — M2141 Flat foot [pes planus] (acquired), right foot: Secondary | ICD-10-CM

## 2013-08-05 MED ORDER — BLACK COHOSH 200 MG PO CAPS: 1.0000 | ORAL_CAPSULE | Freq: Every day | ORAL | Status: DC

## 2013-08-05 MED ORDER — GABAPENTIN 300 MG PO CAPS: ORAL_CAPSULE | ORAL | Status: DC
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
## 2013-08-05 NOTE — Progress Notes (Signed)
Pre visit review using our clinic review tool, if applicable. No additional management support is needed unless otherwise documented below in the visit note. 
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
## 2013-08-05 NOTE — Patient Instructions (Signed)
Good to see you Continue the exercises 3 times a week.  Ice is your best friend.  Continue turmeric twice daily.  In 2 weeks come back and we will make orthotics.

## 2013-08-05 NOTE — Assessment & Plan Note (Signed)
Definitely causing some of patient's discomfort. We will have patient come back for custom orthotics in the future likely. Patient will try over-the-counter orthotics first toe.

## 2013-08-06 ENCOUNTER — Telehealth: Payer: Self-pay | Admitting: Internal Medicine

## 2013-08-06 NOTE — Telephone Encounter (Signed)
Patient called requesting a referral to an endocrinologist for her hot flashes and cold chills. She believes her estradiol and synthroid may  Be the cause. Please advise.

## 2013-08-09 NOTE — Telephone Encounter (Signed)
Ok to wait for Dr Ronnald Ramp return

## 2013-08-15 ENCOUNTER — Other Ambulatory Visit: Payer: Self-pay | Admitting: Internal Medicine

## 2013-08-15 DIAGNOSIS — R232 Flushing: Secondary | ICD-10-CM

## 2013-08-15 DIAGNOSIS — E039 Hypothyroidism, unspecified: Secondary | ICD-10-CM

## 2013-08-15 NOTE — Telephone Encounter (Signed)
done

## 2013-08-20 ENCOUNTER — Encounter: Payer: Self-pay | Admitting: Family Medicine

## 2013-08-20 ENCOUNTER — Ambulatory Visit (INDEPENDENT_AMBULATORY_CARE_PROVIDER_SITE_OTHER): Payer: 59 | Admitting: Family Medicine

## 2013-08-20 VITALS — BP 118/74 | HR 74 | Ht 61.5 in | Wt 162.0 lb

## 2013-08-20 DIAGNOSIS — G8929 Other chronic pain: Secondary | ICD-10-CM

## 2013-08-20 DIAGNOSIS — M2142 Flat foot [pes planus] (acquired), left foot: Secondary | ICD-10-CM

## 2013-08-20 DIAGNOSIS — M2140 Flat foot [pes planus] (acquired), unspecified foot: Secondary | ICD-10-CM

## 2013-08-20 DIAGNOSIS — M2141 Flat foot [pes planus] (acquired), right foot: Secondary | ICD-10-CM

## 2013-08-20 DIAGNOSIS — M549 Dorsalgia, unspecified: Secondary | ICD-10-CM

## 2013-08-20 MED ORDER — BLACK COHOSH 200 MG PO CAPS: 1.0000 | ORAL_CAPSULE | Freq: Every day | ORAL | Status: DC

## 2013-08-20 MED ORDER — PRENATAL PLUS 27-1 MG PO TABS: 1.0000 | ORAL_TABLET | Freq: Every day | ORAL | Status: DC

## 2013-08-20 NOTE — Patient Instructions (Addendum)
Good to see you.  I want you to continue the exercises and the icing.  Continue the other medications.  Continue to wear good shoes. Look at the shoe market for a wide shoe and rigid sole.  When you get the orthotics try to wear them for 2 hours the first day and increase by 1 hour a day until full day.  They will be ready for you by Monday.  Good luck with the MRI and if you could bring a copy so we could keep all the info together.  Come back 3 weeks.

## 2013-08-20 NOTE — Assessment & Plan Note (Signed)
Discussed with patient again. Patient does have severe pes planus bilaterally that is likely contributing to the amount of stress is put on her lumbar spine and other joints. Patient was made custom orthotics today. We discussed the proper where in the length of time to slowly increase to get use to the 6 of the orthotics. We once again discussed the possibility of getting new shoes which will be beneficial. We discussed continuing the icing as well. We discussed the possibility of topical anti-inflammatories which patient declined. Patient will start iron supplementation no. Patient will come back and see me again in 3 weeks for further evaluation and treatment.

## 2013-08-20 NOTE — Assessment & Plan Note (Signed)
Patient's chronic back pain is likely also been treated by the orthopedic surgeon and we will defer because I have been following longer. I do think MRI maybe he could benefit. I do believe that patient back pain can also be somewhat related to the flatfeet the patient is having. Patient was given orthotics today. Patient will followup in 3 weeks for further evaluation.
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
## 2013-08-23 ENCOUNTER — Telehealth: Payer: Self-pay | Admitting: *Deleted

## 2013-08-23 MED ORDER — BLACK COHOSH 450 MG PO CAPS: 1.0000 | ORAL_CAPSULE | Freq: Every day | ORAL | Status: DC

## 2013-08-23 NOTE — Telephone Encounter (Signed)
Left msg on triage stating md gave rx for black cohosh the pharmacist stated they done not carry in that milligram. They only have 40 mg or 450 mg.../lmb

## 2013-08-23 NOTE — Telephone Encounter (Signed)
Refill done. Pt made aware.

## 2013-08-23 NOTE — Telephone Encounter (Signed)
Please call patient and tell the orthotics ready and would do 450mg .

## 2013-09-01 ENCOUNTER — Encounter: Payer: Self-pay | Admitting: Internal Medicine

## 2013-09-01 ENCOUNTER — Ambulatory Visit (INDEPENDENT_AMBULATORY_CARE_PROVIDER_SITE_OTHER): Payer: 59 | Admitting: Internal Medicine

## 2013-09-01 VITALS — BP 106/64 | HR 71 | Temp 98.3°F | Resp 12 | Ht 60.5 in | Wt 162.0 lb

## 2013-09-01 DIAGNOSIS — E039 Hypothyroidism, unspecified: Secondary | ICD-10-CM

## 2013-09-01 LAB — T4, FREE: Free T4: 1.12 ng/dL (ref 0.60–1.60)

## 2013-09-01 LAB — TSH: TSH: 1.35 u[IU]/mL (ref 0.35–4.50)
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
## 2013-09-01 NOTE — Patient Instructions (Addendum)
Please stop at the lab. Please move the Levothyroxine every day, with water, >30 min before b'fast, separated by >4h from anti acid medication, calcium, iron, multivitamins. Please return in 6 months.

## 2013-09-03 NOTE — Telephone Encounter (Signed)
error 

## 2013-09-07 ENCOUNTER — Telehealth: Payer: Self-pay | Admitting: Internal Medicine

## 2013-09-07 DIAGNOSIS — Z0271 Encounter for disability determination: Secondary | ICD-10-CM

## 2013-09-07 NOTE — Telephone Encounter (Signed)
Patient would like her lab results   Thank you

## 2013-09-07 NOTE — Telephone Encounter (Signed)
She needs to check her MyChart:  Entered by Philemon Kingdom, MD at 09/02/2013 8:00 AM  Dear [name],  The thyroid tests are normal. Let's recheck them in 2 months after moving the Synthroid in am and separating it from the acid reflux medication.  Please call our main number to schedule an appointment with the lab.  Sincerely,  Philemon Kingdom MD

## 2013-09-07 NOTE — Telephone Encounter (Signed)
Please advise 

## 2013-09-08 NOTE — Telephone Encounter (Signed)
Noted, patient is aware, she said she did not have access to a computer.

## 2013-09-10 ENCOUNTER — Ambulatory Visit: Payer: 59 | Admitting: Family Medicine

## 2013-11-05 ENCOUNTER — Other Ambulatory Visit: Payer: Self-pay

## 2013-12-01 ENCOUNTER — Ambulatory Visit: Payer: 59 | Admitting: Internal Medicine

## 2013-12-30 ENCOUNTER — Encounter (HOSPITAL_COMMUNITY): Payer: Self-pay | Admitting: Cardiology

## 2014-03-04 ENCOUNTER — Ambulatory Visit: Payer: 59 | Admitting: Internal Medicine

## 2014-05-20 ENCOUNTER — Other Ambulatory Visit: Payer: Self-pay

## 2014-05-20 MED ORDER — ESTRADIOL 1 MG PO TABS: 1.0000 mg | ORAL_TABLET | Freq: Every day | ORAL | Status: DC

## 2014-06-17 ENCOUNTER — Telehealth: Payer: Self-pay | Admitting: Internal Medicine

## 2014-06-17 MED ORDER — ESTRADIOL 1 MG PO TABS: 1.0000 mg | ORAL_TABLET | Freq: Every day | ORAL | Status: DC

## 2014-06-17 MED ORDER — LEVOTHYROXINE SODIUM 150 MCG PO TABS: 150.0000 ug | ORAL_TABLET | Freq: Every day | ORAL | Status: DC

## 2014-06-17 NOTE — Telephone Encounter (Signed)
Pt due for yearly CPX in July sent enough until July...[name]

## 2014-06-17 NOTE — Telephone Encounter (Signed)
Patient needs refill for estradiol (ESTRACE) 1 MG tablet [[id number] and levothyroxine (SYNTHROID, LEVOTHROID) 150 MCG tablet [[id number]]. Patient has not been seen since last July and I informed her she would need an appointment. She stated she can't come in since she has no insurance and that she has been taking these for years.

## 2015-09-23 IMAGING — CR DG CERVICAL SPINE COMPLETE 4+V
6 series · 6 of 6 positions shown · non-contrast
Comparison: None.

CLINICAL DATA: Back Pain

EXAM:
CERVICAL SPINE  4+ VIEWS

[lpo]
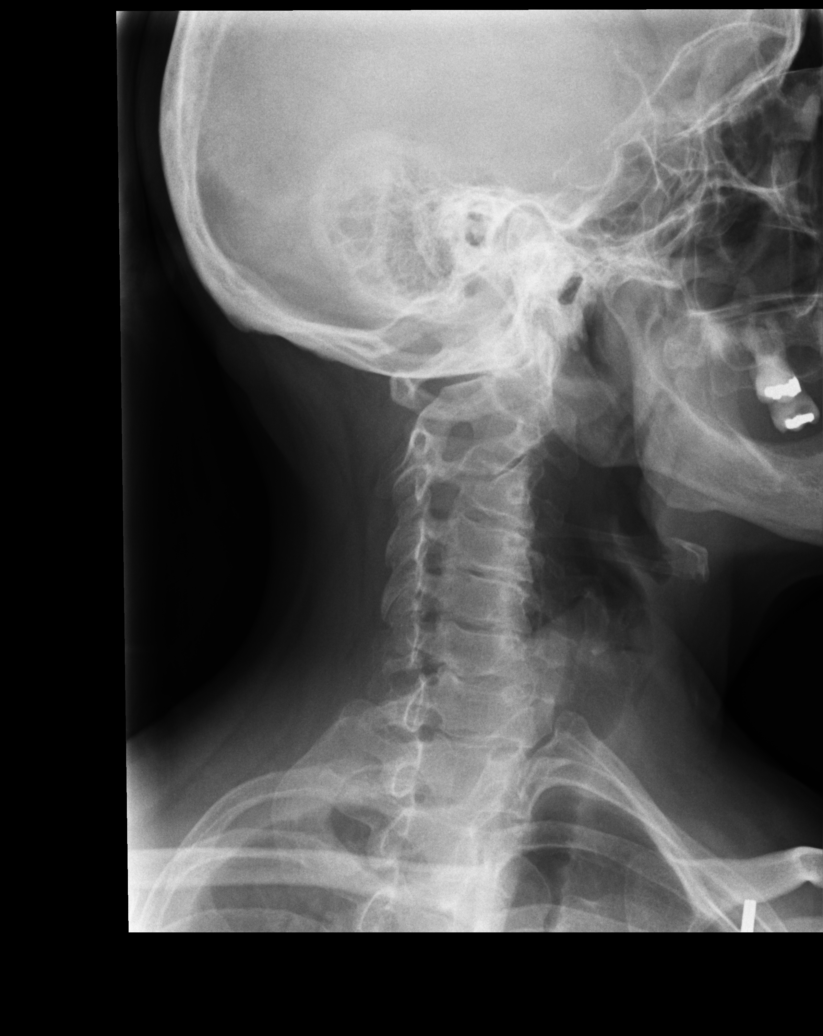

[lateral]
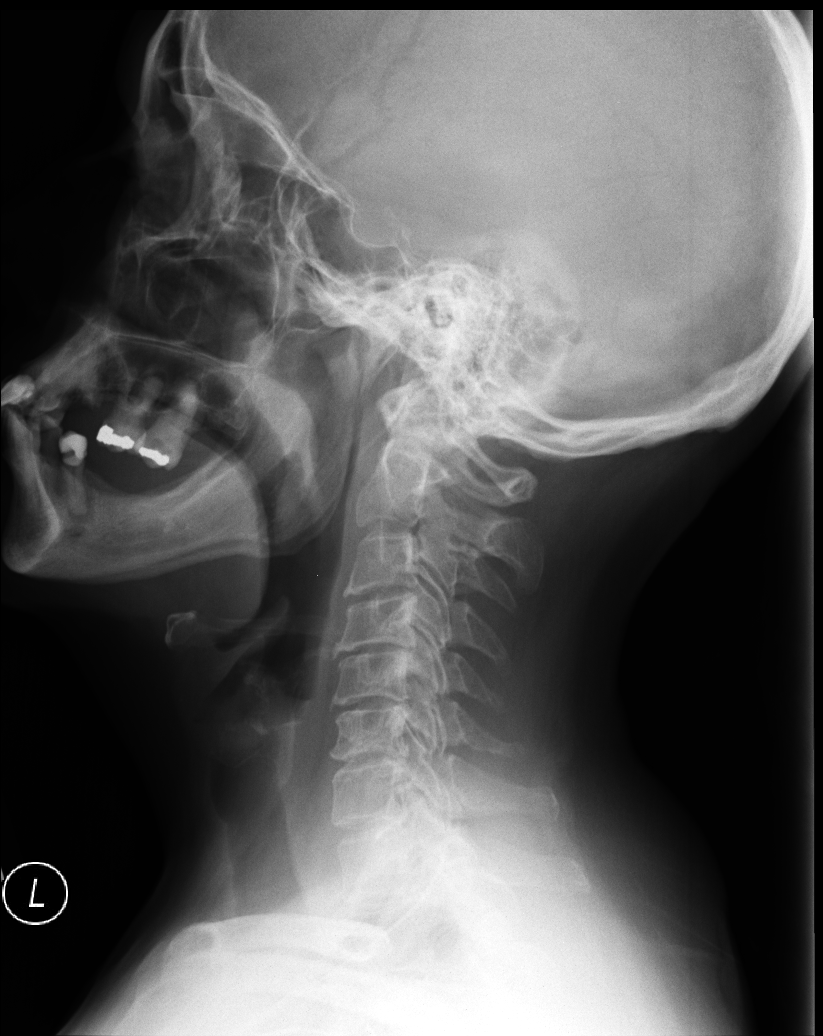

[rpo]
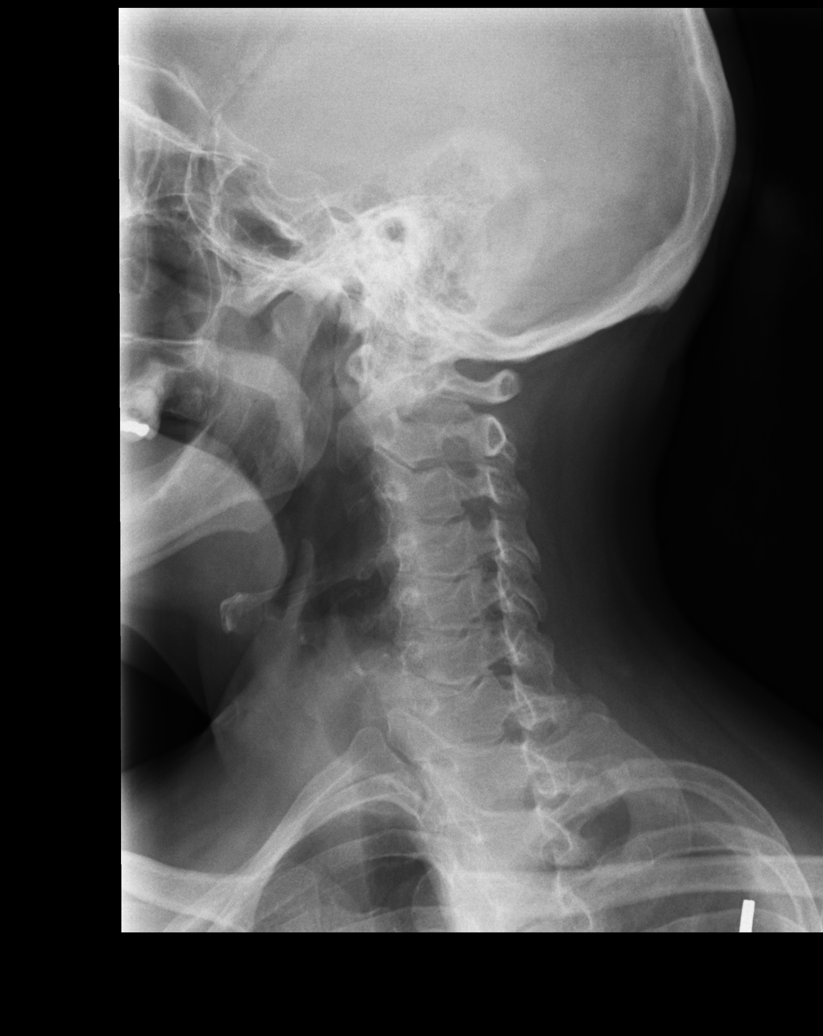

[AP]
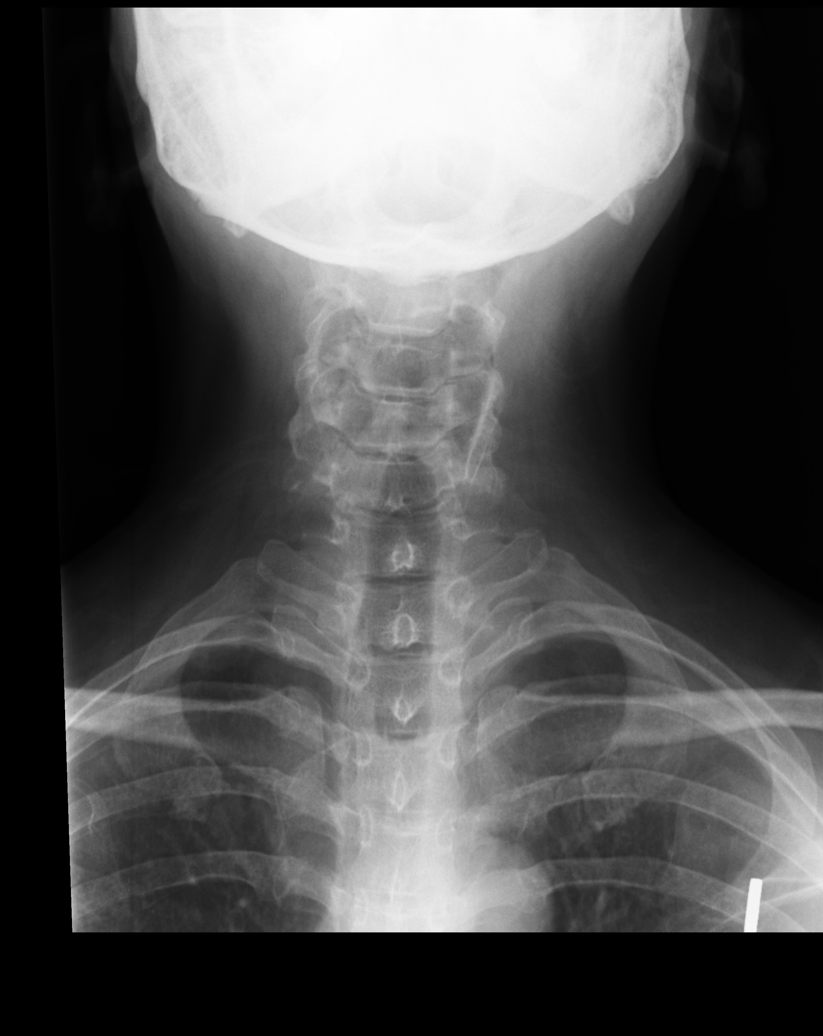

[ap open mouth (1 of 2)]
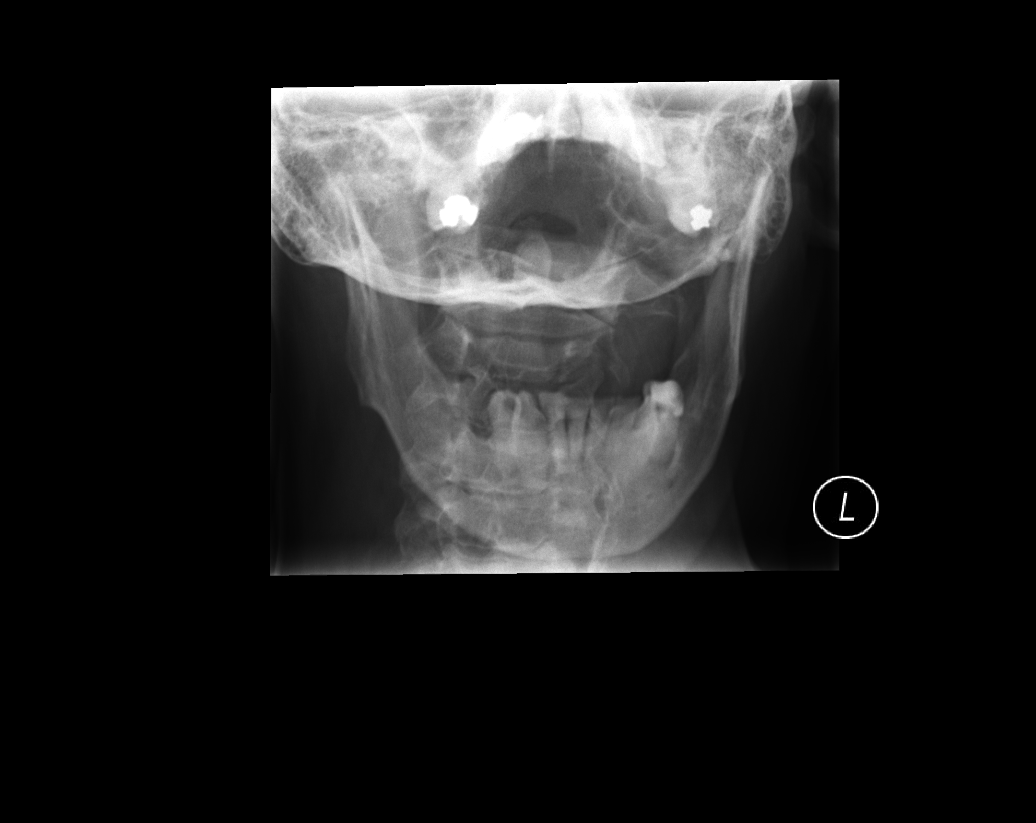

[ap open mouth (2 of 2)]
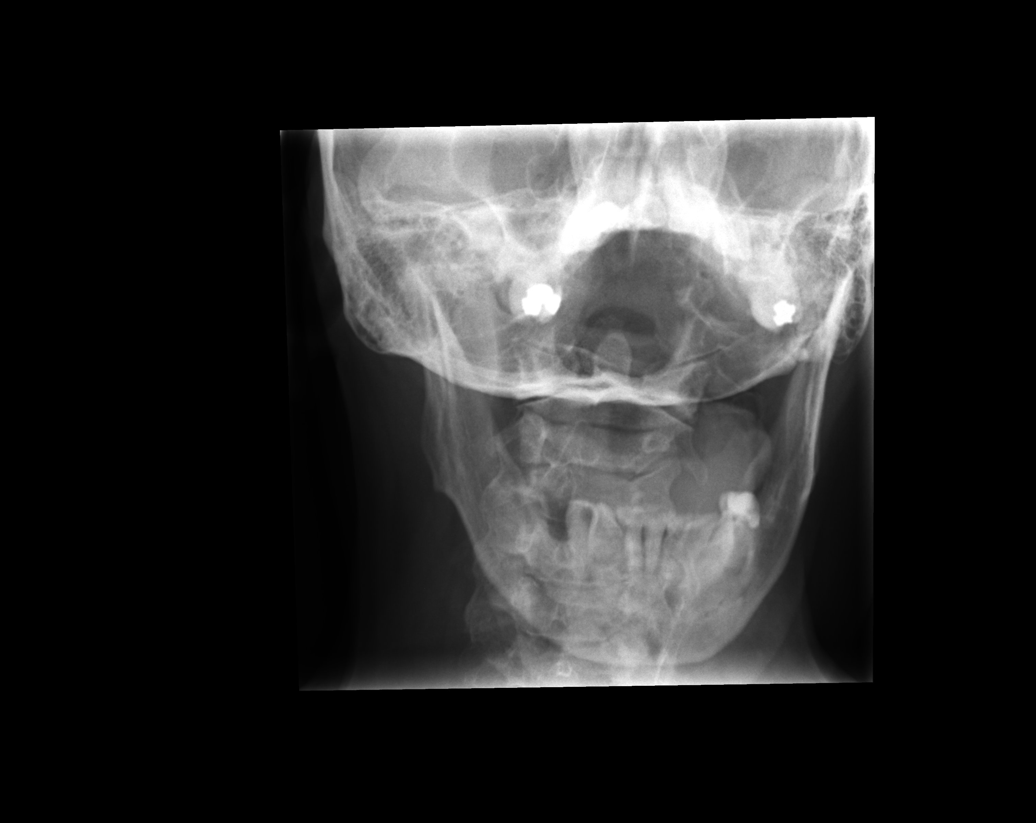

[6 of 6 positions shown; findings below may reference images not displayed]

FINDINGS: There is no evidence of cervical spine fracture or prevertebral soft
tissue swelling. Alignment is normal. Multilevel disc space
narrowing and areas of endplate hypertrophic spurring within the mid
and lower cervical spine.
IMPRESSION: No evidence of acute osseous abnormalities.

Multilevel degenerative disc disease changes.

## 2016-09-26 ENCOUNTER — Telehealth: Payer: Self-pay | Admitting: Internal Medicine

## 2016-09-26 NOTE — Telephone Encounter (Signed)
I called and spoke with the patient- I advised Dr. Caryl Comes is in clinic all day and was there anything I could relay to him.  The patient only wanted to speak with Dr. Caryl Comes and would not give any further information.

## 2016-09-26 NOTE — Telephone Encounter (Signed)
New message      Pt called to ask Dr Caryl Comes for a female PCP recommendation.  Per Dr Caryl Comes, pt was given the name of Dr Rachell Cipro @ [phone].  Now, pt request Dr Caryl Comes return her call at his convenience.

## 2016-10-02 NOTE — Telephone Encounter (Signed)
Per Dr. Caryl Comes- he called and spoke with the patient- she needs a female primary MD. Dr. Caryl Comes has texted Dr. Joylene Draft to find out who may be good to send her too.

## 2016-10-08 ENCOUNTER — Encounter: Payer: Self-pay | Admitting: *Deleted

## 2016-10-08 NOTE — Telephone Encounter (Signed)
Requested female mds The names I got  [name]  [name] [name]

## 2016-10-08 NOTE — Telephone Encounter (Signed)
Letter mailed to the patient with MD names/ addresses/ phone numbers.

## 2018-05-12 ENCOUNTER — Encounter: Payer: Self-pay | Admitting: Family Medicine

## 2018-05-12 ENCOUNTER — Other Ambulatory Visit: Payer: Self-pay

## 2018-05-12 ENCOUNTER — Other Ambulatory Visit: Payer: Self-pay | Admitting: Family Medicine

## 2018-05-12 ENCOUNTER — Telehealth (INDEPENDENT_AMBULATORY_CARE_PROVIDER_SITE_OTHER): Payer: Self-pay | Admitting: Family Medicine

## 2018-05-12 ENCOUNTER — Ambulatory Visit (INDEPENDENT_AMBULATORY_CARE_PROVIDER_SITE_OTHER): Payer: Self-pay | Admitting: Family Medicine

## 2018-05-12 DIAGNOSIS — M353 Polymyalgia rheumatica: Secondary | ICD-10-CM

## 2018-05-12 DIAGNOSIS — E039 Hypothyroidism, unspecified: Secondary | ICD-10-CM

## 2018-05-12 HISTORY — DX: Polymyalgia rheumatica: M35.3

## 2018-05-12 MED ORDER — NAPROXEN 500 MG PO TABS: 500.0000 mg | ORAL_TABLET | Freq: Two times a day (BID) | ORAL | 0 refills | Status: DC

## 2018-05-12 NOTE — Addendum Note (Signed)
Addended by: Dierdre Searles on: 05/12/2018 02:43 PM   Modules accepted: Orders

## 2018-05-12 NOTE — Patient Instructions (Signed)
Naproxen-rx labwork asap

## 2018-05-12 NOTE — Progress Notes (Signed)
Spoke with pt this evening and she states she has been having severe joint and muscle pain since September. Pt states she has a history of thyroid problems and believe this could be contributing to her pain. Pt states the pain is all over her body to the point she is having a hard time getting out of bed. She has requested labs to check her thyroid.
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
## 2018-05-13 ENCOUNTER — Other Ambulatory Visit: Payer: Self-pay | Admitting: Family Medicine

## 2018-05-13 ENCOUNTER — Telehealth: Payer: Self-pay | Admitting: Family Medicine

## 2018-05-13 DIAGNOSIS — E039 Hypothyroidism, unspecified: Secondary | ICD-10-CM

## 2018-05-13 LAB — COMPREHENSIVE METABOLIC PANEL
ALT: 10 IU/L (ref 0–32)
AST: 18 IU/L (ref 0–40)
Albumin/Globulin Ratio: 1.8 (ref 1.2–2.2)
Albumin: 4.4 g/dL (ref 3.8–4.8)
Alkaline Phosphatase: 92 IU/L (ref 39–117)
BUN/Creatinine Ratio: 22 (ref 12–28)
BUN: 14 mg/dL (ref 8–27)
Bilirubin Total: 0.4 mg/dL (ref 0.0–1.2)
CO2: 22 mmol/L (ref 20–29)
Calcium: 9.7 mg/dL (ref 8.7–10.3)
Chloride: 102 mmol/L (ref 96–106)
Creatinine, Ser: 0.65 mg/dL (ref 0.57–1.00)
GFR calc Af Amer: 107 mL/min/{1.73_m2} (ref >59)
GFR calc non Af Amer: 93 mL/min/{1.73_m2} (ref >59)
Globulin, Total: 2.5 g/dL (ref 1.5–4.5)
Glucose: 119 mg/dL — ABNORMAL HIGH (ref 65–99)
Potassium: 4.2 mmol/L (ref 3.5–5.2)
Sodium: 139 mmol/L (ref 134–144)
Total Protein: 6.9 g/dL (ref 6.0–8.5)

## 2018-05-13 LAB — CBC WITH DIFFERENTIAL/PLATELET
Basophils Absolute: 0.1 10*3/uL (ref 0.0–0.2)
Basos: 1 %
EOS (ABSOLUTE): 0.2 10*3/uL (ref 0.0–0.4)
Eos: 3 %
Hematocrit: 40.7 % (ref 34.0–46.6)
Hemoglobin: 13.7 g/dL (ref 11.1–15.9)
Immature Grans (Abs): 0 10*3/uL (ref 0.0–0.1)
Immature Granulocytes: 0 %
Lymphocytes Absolute: 1.8 10*3/uL (ref 0.7–3.1)
Lymphs: 29 %
MCH: 30 pg (ref 26.6–33.0)
MCHC: 33.7 g/dL (ref 31.5–35.7)
MCV: 89 fL (ref 79–97)
Monocytes Absolute: 0.5 10*3/uL (ref 0.1–0.9)
Monocytes: 8 %
Neutrophils Absolute: 3.7 10*3/uL (ref 1.4–7.0)
Neutrophils: 59 %
Platelets: 326 10*3/uL (ref 150–450)
RBC: 4.56 x10E6/uL (ref 3.77–5.28)
RDW: 13.3 % (ref 11.7–15.4)
WBC: 6.3 10*3/uL (ref 3.4–10.8)

## 2018-05-13 LAB — ANA,IFA RA DIAG PNL W/RFLX TIT/PATN
ANA Titer 1: POSITIVE — AB
Cyclic Citrullin Peptide Ab: 9 units (ref 0–19)
Rheumatoid fact SerPl-aCnc: <10 IU/mL (ref 0.0–13.9)

## 2018-05-13 LAB — C-REACTIVE PROTEIN: CRP: 3 mg/L (ref 0–10)

## 2018-05-13 LAB — FANA STAINING PATTERNS
Homogeneous Pattern: 1:640 {titer} — ABNORMAL HIGH
Speckled Pattern: 1:320 {titer} — ABNORMAL HIGH

## 2018-05-13 LAB — SEDIMENTATION RATE: Sed Rate: 52 mm/hr — ABNORMAL HIGH (ref 0–40)

## 2018-05-13 LAB — TSH: TSH: 33.03 u[IU]/mL — ABNORMAL HIGH (ref 0.450–4.500)

## 2018-05-13 MED ORDER — LEVOTHYROXINE SODIUM 75 MCG PO TABS: 75.0000 ug | ORAL_TABLET | Freq: Every day | ORAL | 0 refills | Status: DC

## 2018-05-13 NOTE — Telephone Encounter (Signed)
Copied from [place] [id number]. Topic: Quick Communication - See Telephone Encounter >> May 13, 2018  3:32 PM Valla Leaver wrote: CRM for notification. See Telephone encounter for: 05/13/18. Patient calling for lab results. Also was not able to get naproxen because it was too expensive. Wants to know what else she can take?

## 2018-05-14 ENCOUNTER — Other Ambulatory Visit: Payer: Self-pay

## 2018-05-14 ENCOUNTER — Other Ambulatory Visit: Payer: Self-pay | Admitting: Family Medicine

## 2018-05-14 ENCOUNTER — Encounter: Payer: Self-pay | Admitting: Family Medicine

## 2018-05-14 DIAGNOSIS — M353 Polymyalgia rheumatica: Secondary | ICD-10-CM

## 2018-05-14 MED ORDER — LEVOTHYROXINE SODIUM 75 MCG PO TABS: 75.0000 ug | ORAL_TABLET | Freq: Every day | ORAL | 0 refills | Status: DC

## 2018-05-14 NOTE — Telephone Encounter (Signed)
Please advise on question below.  Thanks

## 2018-05-19 ENCOUNTER — Telehealth: Payer: Self-pay | Admitting: Family Medicine

## 2018-05-19 NOTE — Telephone Encounter (Signed)
Copied from [place] [id number]. Topic: Referral - Question >> May 19, 2018 10:45 AM Valla Leaver wrote: Reason for CRM: Patient would like a call back from Dr. Holly Bodily to discuss [facility] Rheumatology referral and other options. She is getting worse and pain is increasing.

## 2018-07-14 ENCOUNTER — Telehealth: Payer: Self-pay | Admitting: General Practice

## 2018-07-14 NOTE — Telephone Encounter (Signed)
Pt went to [facility] rheumatology, they will be sending her chart to [place].  Pt states that her labs at that office indicate that her thyroid is off, pt is asking for Dr Lattie Haw to look at labs and adjust her meds for thyroid. Please call

## 2018-07-23 ENCOUNTER — Other Ambulatory Visit: Payer: Self-pay | Admitting: Family Medicine

## 2018-07-23 DIAGNOSIS — E039 Hypothyroidism, unspecified: Secondary | ICD-10-CM

## 2018-07-23 MED ORDER — LEVOTHYROXINE SODIUM 88 MCG PO TABS: 88.0000 ug | ORAL_TABLET | Freq: Every day | ORAL | 0 refills | Status: DC

## 2018-07-23 NOTE — Telephone Encounter (Signed)
Called Rheumatology going to fax over

## 2018-10-08 ENCOUNTER — Other Ambulatory Visit: Payer: Self-pay | Admitting: Family Medicine

## 2018-10-09 ENCOUNTER — Encounter: Payer: Self-pay | Admitting: Family Medicine

## 2018-10-12 ENCOUNTER — Telehealth: Payer: Self-pay

## 2018-10-12 DIAGNOSIS — E039 Hypothyroidism, unspecified: Secondary | ICD-10-CM

## 2018-10-12 MED ORDER — LEVOTHYROXINE SODIUM 88 MCG PO TABS: 88.0000 ug | ORAL_TABLET | Freq: Every day | ORAL | 0 refills | Status: DC

## 2018-10-12 NOTE — Telephone Encounter (Signed)
LeighAnn Aramis Zobel, CMA  

## 2018-10-12 NOTE — Telephone Encounter (Signed)
[name], CMA  

## 2018-11-11 ENCOUNTER — Encounter: Payer: Self-pay | Admitting: Family Medicine

## 2019-01-21 ENCOUNTER — Other Ambulatory Visit: Payer: Self-pay | Admitting: Family Medicine

## 2019-01-21 ENCOUNTER — Encounter: Payer: Self-pay | Admitting: Family Medicine

## 2019-01-21 DIAGNOSIS — E039 Hypothyroidism, unspecified: Secondary | ICD-10-CM

## 2019-01-21 MED ORDER — LEVOTHYROXINE SODIUM 88 MCG PO TABS: 88.0000 ug | ORAL_TABLET | Freq: Every day | ORAL | 0 refills | Status: DC

## 2019-01-21 NOTE — Telephone Encounter (Signed)
Requested medication (s) are due for refill today: yes  Requested medication (s) are on the active medication list:  yes  Last refill: 10/12/2018  Future visit scheduled: no  Notes to clinic:  Review for refill   Requested Prescriptions  Pending Prescriptions Disp Refills   levothyroxine (SYNTHROID) 88 MCG tablet [Pharmacy Med Name: Levothyroxine Sodium 88 MCG Oral Tablet] 90 tablet 0    Sig: Take 1 tablet by mouth once daily      There is no refill protocol information for this order

## 2019-01-21 NOTE — Telephone Encounter (Signed)
Needs labwork for additional refills

## 2019-03-12 ENCOUNTER — Encounter: Payer: Self-pay | Admitting: Family Medicine

## 2019-04-02 ENCOUNTER — Telehealth: Payer: Self-pay | Admitting: General Practice

## 2019-04-02 ENCOUNTER — Other Ambulatory Visit: Payer: Self-pay | Admitting: *Deleted

## 2019-04-02 DIAGNOSIS — E039 Hypothyroidism, unspecified: Secondary | ICD-10-CM

## 2019-04-02 MED ORDER — LEVOTHYROXINE SODIUM 88 MCG PO TABS: 88.0000 ug | ORAL_TABLET | Freq: Every day | ORAL | 1 refills | Status: DC

## 2019-04-02 MED ORDER — LEVOTHYROXINE SODIUM 88 MCG PO TABS: 88.0000 ug | ORAL_TABLET | Freq: Every day | ORAL | 0 refills | Status: DC

## 2019-04-02 NOTE — Telephone Encounter (Signed)
Pt called has a toc apt on 05/10/19 and is out of this medication and is wondering if she can get a curtesy refill for it. Pt would like a call regarding this refill [phone]. Please advise.  levothyroxine (SYNTHROID) 88 MCG tablet [id number]   [place], [address]

## 2019-04-02 NOTE — Telephone Encounter (Signed)
Please let patient know that courtesy refill was given and to please keep upcoming appt. thanks

## 2019-04-02 NOTE — Telephone Encounter (Signed)
Pt has an upcoming appt on 05/10/2019 for toc and would like a refill on levothyroxine until then. Is it ok to give a curtesy refill? Please Advise

## 2019-04-05 NOTE — Telephone Encounter (Signed)
Called and informed patient that the medication was sent over , and to remember to keep her upcoming appointments.

## 2019-05-10 ENCOUNTER — Ambulatory Visit: Payer: Self-pay | Admitting: Family Medicine

## 2019-05-11 ENCOUNTER — Ambulatory Visit: Payer: Self-pay | Admitting: Adult Health Nurse Practitioner

## 2019-07-07 ENCOUNTER — Ambulatory Visit (INDEPENDENT_AMBULATORY_CARE_PROVIDER_SITE_OTHER): Payer: Medicare Other | Admitting: Registered Nurse

## 2019-07-07 ENCOUNTER — Encounter: Payer: Self-pay | Admitting: Registered Nurse

## 2019-07-07 ENCOUNTER — Other Ambulatory Visit: Payer: Self-pay

## 2019-07-07 VITALS — BP 159/90 | HR 88 | Temp 97.9°F | Ht 62.0 in | Wt 185.0 lb

## 2019-07-07 DIAGNOSIS — E039 Hypothyroidism, unspecified: Secondary | ICD-10-CM

## 2019-07-07 DIAGNOSIS — Z1231 Encounter for screening mammogram for malignant neoplasm of breast: Secondary | ICD-10-CM

## 2019-07-07 DIAGNOSIS — Z1329 Encounter for screening for other suspected endocrine disorder: Secondary | ICD-10-CM

## 2019-07-07 DIAGNOSIS — Z13220 Encounter for screening for lipoid disorders: Secondary | ICD-10-CM

## 2019-07-07 DIAGNOSIS — I10 Essential (primary) hypertension: Secondary | ICD-10-CM | POA: Diagnosis not present

## 2019-07-07 DIAGNOSIS — Z130 Encounter for screening for diseases of the blood and blood-forming organs and certain disorders involving the immune mechanism: Secondary | ICD-10-CM

## 2019-07-07 DIAGNOSIS — Z13228 Encounter for screening for other metabolic disorders: Secondary | ICD-10-CM

## 2019-07-07 MED ORDER — LEVOTHYROXINE SODIUM 88 MCG PO TABS: 88.0000 ug | ORAL_TABLET | Freq: Every day | ORAL | 1 refills | Status: DC

## 2019-07-07 MED ORDER — AMLODIPINE BESYLATE 5 MG PO TABS: 5.0000 mg | ORAL_TABLET | Freq: Every day | ORAL | 3 refills | Status: DC

## 2019-07-07 NOTE — Patient Instructions (Signed)
° ° ° °  If you have lab work done today you will be contacted with your lab results within the next 2 weeks.  If you have not heard from us then please contact us. The fastest way to get your results is to register for My Chart. ° ° °IF you received an x-ray today, you will receive an invoice from [facility]. Please contact [facility] at [phone] with questions or concerns regarding your invoice.  ° °IF you received labwork today, you will receive an invoice from [facility]. Please contact [facility] at [phone] with questions or concerns regarding your invoice.  ° °Our billing staff will not be able to assist you with questions regarding bills from these companies. ° °You will be contacted with the lab results as soon as they are available. The fastest way to get your results is to activate your My Chart account. Instructions are located on the last page of this paperwork. If you have not heard from us regarding the results in 2 weeks, please contact this office. °  ° ° ° °

## 2019-07-08 ENCOUNTER — Encounter: Payer: Self-pay | Admitting: Registered Nurse

## 2019-07-08 LAB — COMPREHENSIVE METABOLIC PANEL
ALT: 23 IU/L (ref 0–32)
AST: 22 IU/L (ref 0–40)
Albumin/Globulin Ratio: 1.8 (ref 1.2–2.2)
Albumin: 4.9 g/dL — ABNORMAL HIGH (ref 3.8–4.8)
Alkaline Phosphatase: 103 IU/L (ref 48–121)
BUN/Creatinine Ratio: 22 (ref 12–28)
BUN: 14 mg/dL (ref 8–27)
Bilirubin Total: 0.5 mg/dL (ref 0.0–1.2)
CO2: 20 mmol/L (ref 20–29)
Calcium: 9.6 mg/dL (ref 8.7–10.3)
Chloride: 101 mmol/L (ref 96–106)
Creatinine, Ser: 0.65 mg/dL (ref 0.57–1.00)
GFR calc Af Amer: 105 mL/min/{1.73_m2} (ref >59)
GFR calc non Af Amer: 92 mL/min/{1.73_m2} (ref >59)
Globulin, Total: 2.8 g/dL (ref 1.5–4.5)
Glucose: 88 mg/dL (ref 65–99)
Potassium: 3.9 mmol/L (ref 3.5–5.2)
Sodium: 135 mmol/L (ref 134–144)
Total Protein: 7.7 g/dL (ref 6.0–8.5)

## 2019-07-08 LAB — LIPID PANEL
Chol/HDL Ratio: 4.8 ratio — ABNORMAL HIGH (ref 0.0–4.4)
Cholesterol, Total: 300 mg/dL — ABNORMAL HIGH (ref 100–199)
HDL: 62 mg/dL (ref >39)
LDL Chol Calc (NIH): 151 mg/dL — ABNORMAL HIGH (ref 0–99)
Triglycerides: 463 mg/dL — ABNORMAL HIGH (ref 0–149)
VLDL Cholesterol Cal: 87 mg/dL — ABNORMAL HIGH (ref 5–40)

## 2019-07-08 LAB — TSH: TSH: 40.2 u[IU]/mL — ABNORMAL HIGH (ref 0.450–4.500)

## 2019-07-09 ENCOUNTER — Other Ambulatory Visit: Payer: Self-pay | Admitting: Registered Nurse

## 2019-07-09 ENCOUNTER — Encounter: Payer: Self-pay | Admitting: Registered Nurse

## 2019-07-09 DIAGNOSIS — E039 Hypothyroidism, unspecified: Secondary | ICD-10-CM

## 2019-07-09 DIAGNOSIS — E782 Mixed hyperlipidemia: Secondary | ICD-10-CM

## 2019-07-09 MED ORDER — ROSUVASTATIN CALCIUM 10 MG PO TABS: 10.0000 mg | ORAL_TABLET | Freq: Every day | ORAL | 3 refills | Status: DC

## 2019-07-09 MED ORDER — LEVOTHYROXINE SODIUM 100 MCG PO TABS: 100.0000 ug | ORAL_TABLET | Freq: Every day | ORAL | 0 refills | Status: DC

## 2019-07-09 NOTE — Telephone Encounter (Signed)
Sent pt message via MyChart  Thank you  Kathrin Ruddy, NP

## 2019-07-12 ENCOUNTER — Encounter: Payer: Self-pay | Admitting: Registered Nurse

## 2019-07-13 NOTE — Telephone Encounter (Signed)
Pt using [facility] please send  Rosuvastatin  [facility] at [address] [place], [place]

## 2019-07-14 ENCOUNTER — Other Ambulatory Visit: Payer: Self-pay | Admitting: Registered Nurse

## 2019-07-14 DIAGNOSIS — E782 Mixed hyperlipidemia: Secondary | ICD-10-CM

## 2019-07-14 MED ORDER — ROSUVASTATIN CALCIUM 10 MG PO TABS: 10.0000 mg | ORAL_TABLET | Freq: Every day | ORAL | 3 refills | Status: AC

## 2019-10-01 ENCOUNTER — Encounter: Payer: Self-pay | Admitting: Registered Nurse

## 2019-10-13 ENCOUNTER — Encounter: Payer: Self-pay | Admitting: Registered Nurse
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
## 2019-10-14 ENCOUNTER — Other Ambulatory Visit: Payer: Self-pay | Admitting: Registered Nurse

## 2019-10-14 DIAGNOSIS — Z1231 Encounter for screening mammogram for malignant neoplasm of breast: Secondary | ICD-10-CM

## 2019-10-18 ENCOUNTER — Other Ambulatory Visit: Payer: Self-pay

## 2019-10-18 ENCOUNTER — Ambulatory Visit
Admission: RE | Admit: 2019-10-18 | Discharge: 2019-10-18 | Disposition: A | Discharge status: Home / Self Care | Payer: Medicare Other | Source: Ambulatory Visit | Attending: Registered Nurse | Admitting: Registered Nurse

## 2019-10-18 DIAGNOSIS — Z1231 Encounter for screening mammogram for malignant neoplasm of breast: Secondary | ICD-10-CM

## 2019-10-29 ENCOUNTER — Encounter: Payer: Self-pay | Admitting: Registered Nurse

## 2019-11-01 NOTE — Telephone Encounter (Signed)
Pt has some concerns about dense breast tissue and many different kinds of cancer that run in her family. Please advise

## 2019-11-04 ENCOUNTER — Other Ambulatory Visit: Payer: Self-pay | Admitting: Registered Nurse

## 2019-11-04 DIAGNOSIS — E039 Hypothyroidism, unspecified: Secondary | ICD-10-CM

## 2019-11-23 ENCOUNTER — Encounter: Payer: Self-pay | Admitting: Registered Nurse

## 2020-02-22 ENCOUNTER — Other Ambulatory Visit: Payer: Self-pay | Admitting: Registered Nurse

## 2020-02-22 DIAGNOSIS — E039 Hypothyroidism, unspecified: Secondary | ICD-10-CM

## 2020-02-22 NOTE — Telephone Encounter (Signed)
02/22/2020 - PATIENT IS REQUESTING A REFILL ON HER EUTHYROX 100 mcg. I HAVE SCHEDULED HER OFFICE VISIT WITH RICH MORROW ON Monday 9/35/7017 AT 79:39 am. FELICIA K. HAS GIVEN HER A 30 DAY COURTESY REFILL. I DO NOT HAVE TO ROUTE BACK TO THE CLINICAL TEAM AT THIS TIME. [place]

## 2020-02-22 NOTE — Telephone Encounter (Signed)
No further refills without office visit 

## 2020-02-22 NOTE — Telephone Encounter (Signed)
30 day supply has been sent  

## 2020-02-22 NOTE — Telephone Encounter (Signed)
Please schedule f/u appt for hypothyroidism no further refills without office visit

## 2020-03-13 ENCOUNTER — Ambulatory Visit: Payer: Medicare Other | Admitting: Registered Nurse

## 2020-03-13 ENCOUNTER — Ambulatory Visit (INDEPENDENT_AMBULATORY_CARE_PROVIDER_SITE_OTHER): Payer: Medicare Other | Admitting: Registered Nurse

## 2020-03-13 ENCOUNTER — Telehealth: Payer: Self-pay | Admitting: Registered Nurse

## 2020-03-13 ENCOUNTER — Other Ambulatory Visit: Payer: Self-pay

## 2020-03-13 ENCOUNTER — Encounter: Payer: Self-pay | Admitting: Registered Nurse

## 2020-03-13 VITALS — BP 137/87 | HR 89 | Temp 98.0°F | Resp 18 | Ht 62.0 in | Wt 178.2 lb

## 2020-03-13 DIAGNOSIS — E782 Mixed hyperlipidemia: Secondary | ICD-10-CM | POA: Diagnosis not present

## 2020-03-13 DIAGNOSIS — E039 Hypothyroidism, unspecified: Secondary | ICD-10-CM

## 2020-03-13 DIAGNOSIS — E2839 Other primary ovarian failure: Secondary | ICD-10-CM

## 2020-03-13 DIAGNOSIS — E559 Vitamin D deficiency, unspecified: Secondary | ICD-10-CM

## 2020-03-13 DIAGNOSIS — M069 Rheumatoid arthritis, unspecified: Secondary | ICD-10-CM

## 2020-03-13 DIAGNOSIS — Z23 Encounter for immunization: Secondary | ICD-10-CM

## 2020-03-13 DIAGNOSIS — I10 Essential (primary) hypertension: Secondary | ICD-10-CM

## 2020-03-13 DIAGNOSIS — Z8639 Personal history of other endocrine, nutritional and metabolic disease: Secondary | ICD-10-CM

## 2020-03-13 NOTE — Patient Instructions (Signed)
° ° ° °  If you have lab work done today you will be contacted with your lab results within the next 2 weeks.  If you have not heard from us then please contact us. The fastest way to get your results is to register for My Chart. ° ° °IF you received an x-ray today, you will receive an invoice from [facility]. Please contact [facility] at [phone] with questions or concerns regarding your invoice.  ° °IF you received labwork today, you will receive an invoice from [facility]. Please contact [facility] at [phone] with questions or concerns regarding your invoice.  ° °Our billing staff will not be able to assist you with questions regarding bills from these companies. ° °You will be contacted with the lab results as soon as they are available. The fastest way to get your results is to activate your My Chart account. Instructions are located on the last page of this paperwork. If you have not heard from us regarding the results in 2 weeks, please contact this office. °  ° ° ° °

## 2020-03-13 NOTE — Telephone Encounter (Signed)
Patient received first shingles shot today, and wants to pick up the script for her 2nd shingles shot when it's ready. Please advise at [phone]

## 2020-03-13 NOTE — Telephone Encounter (Signed)
Can you send an order to the pharmacy dated for 2-6 months for the shingdrix vaccine.
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
## 2020-03-14 LAB — CBC WITH DIFFERENTIAL
Basophils Absolute: 0.1 10*3/uL (ref 0.0–0.2)
Basos: 1 %
EOS (ABSOLUTE): 0.2 10*3/uL (ref 0.0–0.4)
Eos: 3 %
Hematocrit: 42.7 % (ref 34.0–46.6)
Hemoglobin: 14.5 g/dL (ref 11.1–15.9)
Immature Grans (Abs): 0 10*3/uL (ref 0.0–0.1)
Immature Granulocytes: 0 %
Lymphocytes Absolute: 1.5 10*3/uL (ref 0.7–3.1)
Lymphs: 31 %
MCH: 30.5 pg (ref 26.6–33.0)
MCHC: 34 g/dL (ref 31.5–35.7)
MCV: 90 fL (ref 79–97)
Monocytes Absolute: 0.4 10*3/uL (ref 0.1–0.9)
Monocytes: 9 %
Neutrophils Absolute: 2.6 10*3/uL (ref 1.4–7.0)
Neutrophils: 56 %
RBC: 4.75 x10E6/uL (ref 3.77–5.28)
RDW: 13.1 % (ref 11.7–15.4)
WBC: 4.7 10*3/uL (ref 3.4–10.8)

## 2020-03-14 LAB — HEMOGLOBIN A1C
Est. average glucose Bld gHb Est-mCnc: 117 mg/dL
Hgb A1c MFr Bld: 5.7 % — ABNORMAL HIGH (ref 4.8–5.6)

## 2020-03-14 LAB — VITAMIN D 25 HYDROXY (VIT D DEFICIENCY, FRACTURES): Vit D, 25-Hydroxy: 24.8 ng/mL — ABNORMAL LOW (ref 30.0–100.0)

## 2020-03-14 LAB — COMPREHENSIVE METABOLIC PANEL
ALT: 16 IU/L (ref 0–32)
AST: 21 IU/L (ref 0–40)
Albumin/Globulin Ratio: 1.2 (ref 1.2–2.2)
Albumin: 4.3 g/dL (ref 3.8–4.8)
Alkaline Phosphatase: 139 IU/L — ABNORMAL HIGH (ref 44–121)
BUN/Creatinine Ratio: 23 (ref 12–28)
BUN: 14 mg/dL (ref 8–27)
Bilirubin Total: 0.4 mg/dL (ref 0.0–1.2)
CO2: 18 mmol/L — ABNORMAL LOW (ref 20–29)
Calcium: 9.8 mg/dL (ref 8.7–10.3)
Chloride: 104 mmol/L (ref 96–106)
Creatinine, Ser: 0.61 mg/dL (ref 0.57–1.00)
GFR calc Af Amer: 108 mL/min/{1.73_m2} (ref >59)
GFR calc non Af Amer: 93 mL/min/{1.73_m2} (ref >59)
Globulin, Total: 3.5 g/dL (ref 1.5–4.5)
Glucose: 105 mg/dL — ABNORMAL HIGH (ref 65–99)
Potassium: 4.4 mmol/L (ref 3.5–5.2)
Sodium: 139 mmol/L (ref 134–144)
Total Protein: 7.8 g/dL (ref 6.0–8.5)

## 2020-03-14 LAB — LIPID PANEL
Chol/HDL Ratio: 3.4 ratio (ref 0.0–4.4)
Cholesterol, Total: 207 mg/dL — ABNORMAL HIGH (ref 100–199)
HDL: 61 mg/dL (ref >39)
LDL Chol Calc (NIH): 99 mg/dL (ref 0–99)
Triglycerides: 281 mg/dL — ABNORMAL HIGH (ref 0–149)
VLDL Cholesterol Cal: 47 mg/dL — ABNORMAL HIGH (ref 5–40)

## 2020-03-14 LAB — THYROID PANEL WITH TSH
Free Thyroxine Index: 3 (ref 1.2–4.9)
T3 Uptake Ratio: 29 % (ref 24–39)
T4, Total: 10.3 ug/dL (ref 4.5–12.0)
TSH: 2.98 u[IU]/mL (ref 0.450–4.500)

## 2020-03-15 ENCOUNTER — Encounter: Payer: Self-pay | Admitting: Registered Nurse

## 2020-03-15 NOTE — Telephone Encounter (Signed)
Patient was informed to alternate warm and cold compresses and take tylenol or ibuprofen if no better in a few days to give [place] a call back. Patient stated her arthritis was acting up and she thought this was due to the shot. Patient was informed this most likely is not a reason for her arthritis but to keep an eye on it and let us know if the compresses do not help.

## 2020-03-22 ENCOUNTER — Other Ambulatory Visit: Payer: Self-pay | Admitting: Registered Nurse

## 2020-03-22 ENCOUNTER — Ambulatory Visit: Payer: Medicare Other | Admitting: Registered Nurse

## 2020-03-22 DIAGNOSIS — E039 Hypothyroidism, unspecified: Secondary | ICD-10-CM

## 2020-03-24 ENCOUNTER — Encounter: Payer: Self-pay | Admitting: Registered Nurse

## 2020-04-25 ENCOUNTER — Other Ambulatory Visit: Payer: Self-pay | Admitting: Registered Nurse

## 2020-04-25 DIAGNOSIS — E039 Hypothyroidism, unspecified: Secondary | ICD-10-CM

## 2020-05-29 ENCOUNTER — Other Ambulatory Visit: Payer: Self-pay | Admitting: Registered Nurse

## 2020-05-29 DIAGNOSIS — E039 Hypothyroidism, unspecified: Secondary | ICD-10-CM

## 2020-06-13 ENCOUNTER — Ambulatory Visit (INDEPENDENT_AMBULATORY_CARE_PROVIDER_SITE_OTHER): Payer: Medicare Other | Admitting: Registered Nurse

## 2020-06-13 ENCOUNTER — Other Ambulatory Visit: Payer: Self-pay

## 2020-06-13 ENCOUNTER — Encounter: Payer: Self-pay | Admitting: Registered Nurse

## 2020-06-13 VITALS — BP 139/75 | HR 73 | Temp 98.0°F | Resp 18 | Ht 62.0 in | Wt 173.8 lb

## 2020-06-13 DIAGNOSIS — Z23 Encounter for immunization: Secondary | ICD-10-CM

## 2020-06-13 DIAGNOSIS — E039 Hypothyroidism, unspecified: Secondary | ICD-10-CM | POA: Diagnosis not present
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
## 2020-06-13 NOTE — Patient Instructions (Addendum)
[name] -   Doristine Devoid to see you  BP is borderline but ok today. No need to change anything  We'll check on the thyroid today. If it's abnormal, we'll follow up in 3 months. If it's normal we can plan on 6 mo  Let me know if you have further concerns.  Thank you  Rich     If you have lab work done today you will be contacted with your lab results within the next 2 weeks.  If you have not heard from [place] then please contact us. The fastest way to get your results is to register for My Chart.   IF you received an x-ray today, you will receive an invoice from [facility]. Please contact [facility] at [phone] with questions or concerns regarding your invoice.   IF you received labwork today, you will receive an invoice from [facility]. Please contact [facility] at [phone] with questions or concerns regarding your invoice.   Our billing staff will not be able to assist you with questions regarding bills from these companies.  You will be contacted with the lab results as soon as they are available. The fastest way to get your results is to activate your My Chart account. Instructions are located on the last page of this paperwork. If you have not heard from [place] regarding the results in 2 weeks, please contact this office.

## 2020-06-14 LAB — THYROID PANEL WITH TSH
Free Thyroxine Index: 3.5 (ref 1.4–3.8)
T3 Uptake: 28 % (ref 22–35)
T4, Total: 12.4 ug/dL — ABNORMAL HIGH (ref 5.1–11.9)
TSH: 3.61 mIU/L (ref 0.40–4.50)

## 2020-06-16 ENCOUNTER — Encounter: Payer: Self-pay | Admitting: Registered Nurse

## 2020-06-16 ENCOUNTER — Other Ambulatory Visit: Payer: Self-pay | Admitting: Registered Nurse

## 2020-06-16 DIAGNOSIS — E6609 Other obesity due to excess calories: Secondary | ICD-10-CM

## 2020-06-16 DIAGNOSIS — R7303 Prediabetes: Secondary | ICD-10-CM

## 2020-06-16 DIAGNOSIS — Z6830 Body mass index (BMI) 30.0-30.9, adult: Secondary | ICD-10-CM

## 2020-06-16 MED ORDER — SEMAGLUTIDE(0.25 OR 0.5MG/DOS) 2 MG/1.5ML ~~LOC~~ SOPN: 0.5000 mg | PEN_INJECTOR | SUBCUTANEOUS | 0 refills | Status: AC

## 2020-06-16 NOTE — Progress Notes (Signed)
Labs reassuring.  Can start semaglutide 0.5mg  subq weekly per conversation at recent visit

## 2020-06-25 ENCOUNTER — Other Ambulatory Visit: Payer: Self-pay | Admitting: Registered Nurse

## 2020-06-25 DIAGNOSIS — E039 Hypothyroidism, unspecified: Secondary | ICD-10-CM

## 2020-06-26 ENCOUNTER — Encounter: Payer: Self-pay | Admitting: Registered Nurse

## 2020-07-25 ENCOUNTER — Other Ambulatory Visit: Payer: Self-pay | Admitting: Registered Nurse

## 2020-07-25 DIAGNOSIS — E039 Hypothyroidism, unspecified: Secondary | ICD-10-CM

## 2020-07-25 DIAGNOSIS — I10 Essential (primary) hypertension: Secondary | ICD-10-CM

## 2020-08-03 ENCOUNTER — Encounter: Payer: Self-pay | Admitting: Registered Nurse

## 2020-08-11 ENCOUNTER — Encounter: Payer: Self-pay | Admitting: Registered Nurse

## 2020-08-24 ENCOUNTER — Other Ambulatory Visit: Payer: Self-pay | Admitting: Registered Nurse

## 2020-08-24 DIAGNOSIS — E039 Hypothyroidism, unspecified: Secondary | ICD-10-CM

## 2020-09-23 ENCOUNTER — Other Ambulatory Visit: Payer: Self-pay | Admitting: Registered Nurse

## 2020-09-23 DIAGNOSIS — E039 Hypothyroidism, unspecified: Secondary | ICD-10-CM

## 2020-11-12 NOTE — Telephone Encounter (Signed)
This concern has been previously addressed by myself and/or another provider.  If they patient has ongoing concerns, they can contact me at their convenience.  Thank you,  Rich Nnamdi Dacus, NP 

## 2020-11-13 NOTE — Telephone Encounter (Signed)
This concern has been previously addressed by myself and/or another provider.  If they patient has ongoing concerns, they can contact me at their convenience.  Thank you,  Rich Tanae Petrosky, NP 

## 2020-11-29 ENCOUNTER — Telehealth: Payer: Self-pay | Admitting: Registered Nurse

## 2020-11-29 NOTE — Telephone Encounter (Signed)
Caller name:[facility]  On DPR? :yes/no: No  Call back number: [phone]  Provider they see: Orland Mustard  Reason for call: Pharmacy needs the ok from the provider to change the Euthyrox manufacture. Also wanted to let Delfino Lovett know that the rx he sent in was still listed under the [address] address.

## 2020-11-30 NOTE — Telephone Encounter (Signed)
Ok to Therapist, music.  Thank you for heads up on address - will adjust at next rx  Thanks  Rich

## 2020-12-11 ENCOUNTER — Ambulatory Visit: Payer: Medicare Other | Admitting: Registered Nurse

## 2021-02-24 ENCOUNTER — Other Ambulatory Visit: Payer: Self-pay | Admitting: Registered Nurse

## 2021-02-24 DIAGNOSIS — E039 Hypothyroidism, unspecified: Secondary | ICD-10-CM

## 2021-02-24 DIAGNOSIS — I10 Essential (primary) hypertension: Secondary | ICD-10-CM

## 2021-03-22 ENCOUNTER — Other Ambulatory Visit: Payer: Self-pay | Admitting: Registered Nurse

## 2021-03-22 DIAGNOSIS — E039 Hypothyroidism, unspecified: Secondary | ICD-10-CM

## 2021-03-22 DIAGNOSIS — I10 Essential (primary) hypertension: Secondary | ICD-10-CM
